# Patient Record
Sex: Female | Born: 1965
Health system: Southern US, Community
[De-identification: ages and names within clinical notes are randomized; demographics above are authoritative.]

## PROBLEM LIST (undated history)

## (undated) DIAGNOSIS — E559 Vitamin D deficiency, unspecified: Secondary | ICD-10-CM

## (undated) DIAGNOSIS — E0590 Thyrotoxicosis, unspecified without thyrotoxic crisis or storm: Secondary | ICD-10-CM

## (undated) DIAGNOSIS — M797 Fibromyalgia: Secondary | ICD-10-CM

## (undated) DIAGNOSIS — R002 Palpitations: Secondary | ICD-10-CM

## (undated) DIAGNOSIS — I10 Essential (primary) hypertension: Secondary | ICD-10-CM

## (undated) DIAGNOSIS — M351 Other overlap syndromes: Secondary | ICD-10-CM

## (undated) DIAGNOSIS — F419 Anxiety disorder, unspecified: Secondary | ICD-10-CM

## (undated) DIAGNOSIS — I7300 Raynaud's syndrome without gangrene: Secondary | ICD-10-CM

## (undated) DIAGNOSIS — E119 Type 2 diabetes mellitus without complications: Secondary | ICD-10-CM

## (undated) DIAGNOSIS — G4700 Insomnia, unspecified: Secondary | ICD-10-CM

## (undated) DIAGNOSIS — E782 Mixed hyperlipidemia: Secondary | ICD-10-CM

## (undated) DIAGNOSIS — M5136 Other intervertebral disc degeneration, lumbar region: Secondary | ICD-10-CM

## (undated) DIAGNOSIS — E785 Hyperlipidemia, unspecified: Secondary | ICD-10-CM

## (undated) DIAGNOSIS — F32A Depression, unspecified: Secondary | ICD-10-CM

## (undated) DIAGNOSIS — M359 Systemic involvement of connective tissue, unspecified: Secondary | ICD-10-CM

## (undated) DIAGNOSIS — G894 Chronic pain syndrome: Secondary | ICD-10-CM

## (undated) DIAGNOSIS — R079 Chest pain, unspecified: Secondary | ICD-10-CM

## (undated) DIAGNOSIS — E538 Deficiency of other specified B group vitamins: Secondary | ICD-10-CM

## (undated) HISTORY — DX: Anxiety disorder, unspecified: F41.9

## (undated) HISTORY — DX: Insomnia, unspecified: G47.00

## (undated) HISTORY — DX: Fibromyalgia: M79.7

## (undated) HISTORY — DX: Other intervertebral disc degeneration, lumbar region: M51.36

## (undated) HISTORY — DX: Chest pain, unspecified: R07.9

## (undated) HISTORY — DX: Systemic involvement of connective tissue, unspecified: M35.9

## (undated) HISTORY — DX: Depression, unspecified: F32.A

## (undated) HISTORY — DX: Raynaud's syndrome without gangrene: I73.00

## (undated) HISTORY — DX: Deficiency of other specified B group vitamins: E53.8

## (undated) HISTORY — DX: Vitamin D deficiency, unspecified: E55.9

## (undated) HISTORY — DX: Mixed hyperlipidemia: E78.2

## (undated) HISTORY — DX: Hyperlipidemia, unspecified: E78.5

## (undated) HISTORY — DX: Essential (primary) hypertension: I10

## (undated) HISTORY — DX: Other overlap syndromes: M35.1

## (undated) HISTORY — DX: Palpitations: R00.2

## (undated) HISTORY — DX: Chronic pain syndrome: G89.4

## (undated) HISTORY — DX: Thyrotoxicosis, unspecified without thyrotoxic crisis or storm: E05.90

## (undated) HISTORY — DX: Type 2 diabetes mellitus without complications: E11.9

---

## 1989-02-21 HISTORY — PX: TUBAL LIGATION: SHX77

## 2001-10-25 ENCOUNTER — Inpatient Hospital Stay (HOSPITAL_COMMUNITY): Admission: EM | Admit: 2001-10-25 | Discharge: 2001-11-01 | Payer: Self-pay | Admitting: Psychiatry

## 2002-02-21 HISTORY — PX: ANTERIOR CRUCIATE LIGAMENT REPAIR: SHX115

## 2002-07-24 ENCOUNTER — Encounter: Payer: Self-pay | Admitting: Emergency Medicine

## 2002-07-24 ENCOUNTER — Emergency Department (HOSPITAL_COMMUNITY): Admission: EM | Admit: 2002-07-24 | Discharge: 2002-07-24 | Payer: Self-pay | Admitting: Emergency Medicine

## 2002-08-14 ENCOUNTER — Encounter: Admission: RE | Admit: 2002-08-14 | Discharge: 2002-08-14 | Payer: Self-pay | Admitting: Internal Medicine

## 2002-08-14 ENCOUNTER — Encounter: Payer: Self-pay | Admitting: Internal Medicine

## 2003-09-23 ENCOUNTER — Ambulatory Visit (HOSPITAL_COMMUNITY): Admission: RE | Admit: 2003-09-23 | Discharge: 2003-09-23 | Payer: Self-pay | Admitting: Internal Medicine

## 2003-10-10 ENCOUNTER — Emergency Department (HOSPITAL_COMMUNITY): Admission: EM | Admit: 2003-10-10 | Discharge: 2003-10-10 | Payer: Self-pay | Admitting: Emergency Medicine

## 2003-10-20 ENCOUNTER — Ambulatory Visit (HOSPITAL_COMMUNITY): Admission: RE | Admit: 2003-10-20 | Discharge: 2003-10-20 | Payer: Self-pay | Admitting: Internal Medicine

## 2006-01-01 IMAGING — CR DG CHEST 2V
2 series · 2 of 2 positions shown · non-contrast
Comparison: none

CLINICAL DATA: Chest pain and shortness of breath.  
 TWO VIEW CHEST
 The heart size and mediastinal contours are normal. The lungs are clear. The visualized skeleton is unremarkable.

 IMPRESSION
 No active disease.

[view not recorded (1 of 2)]
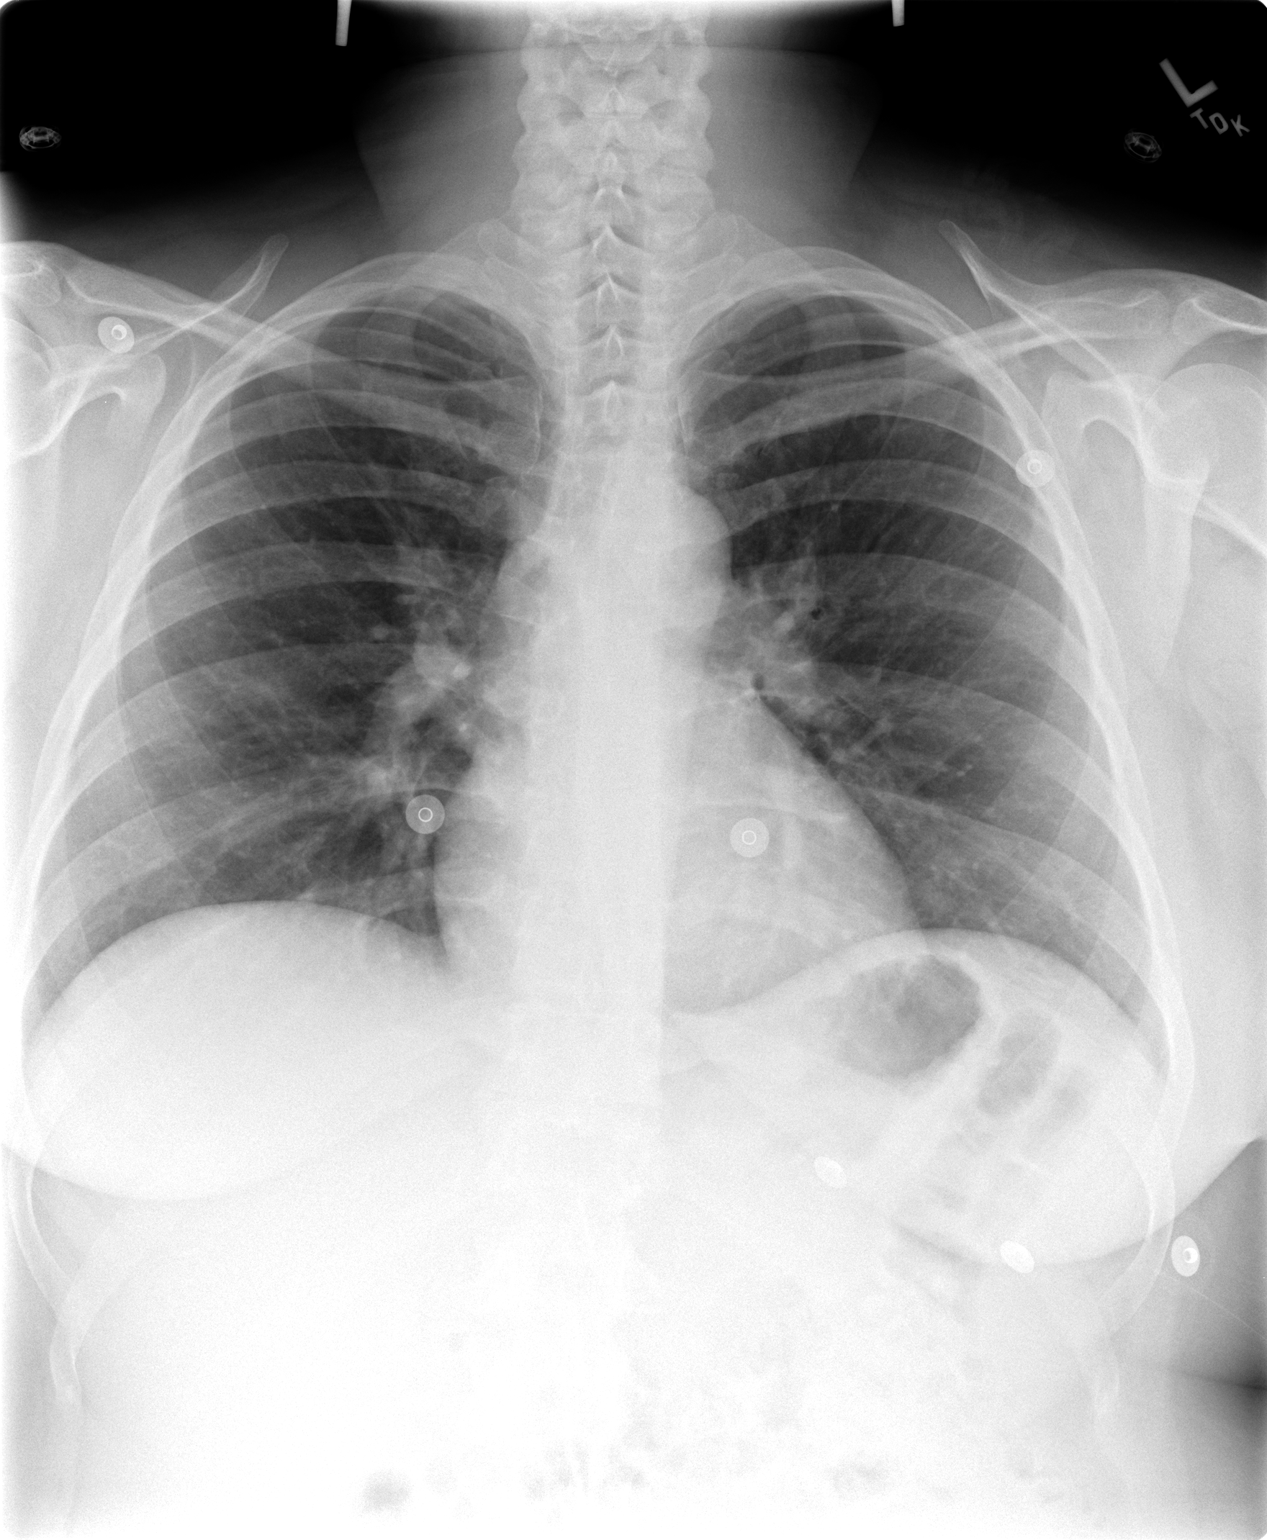

[view not recorded (2 of 2)]
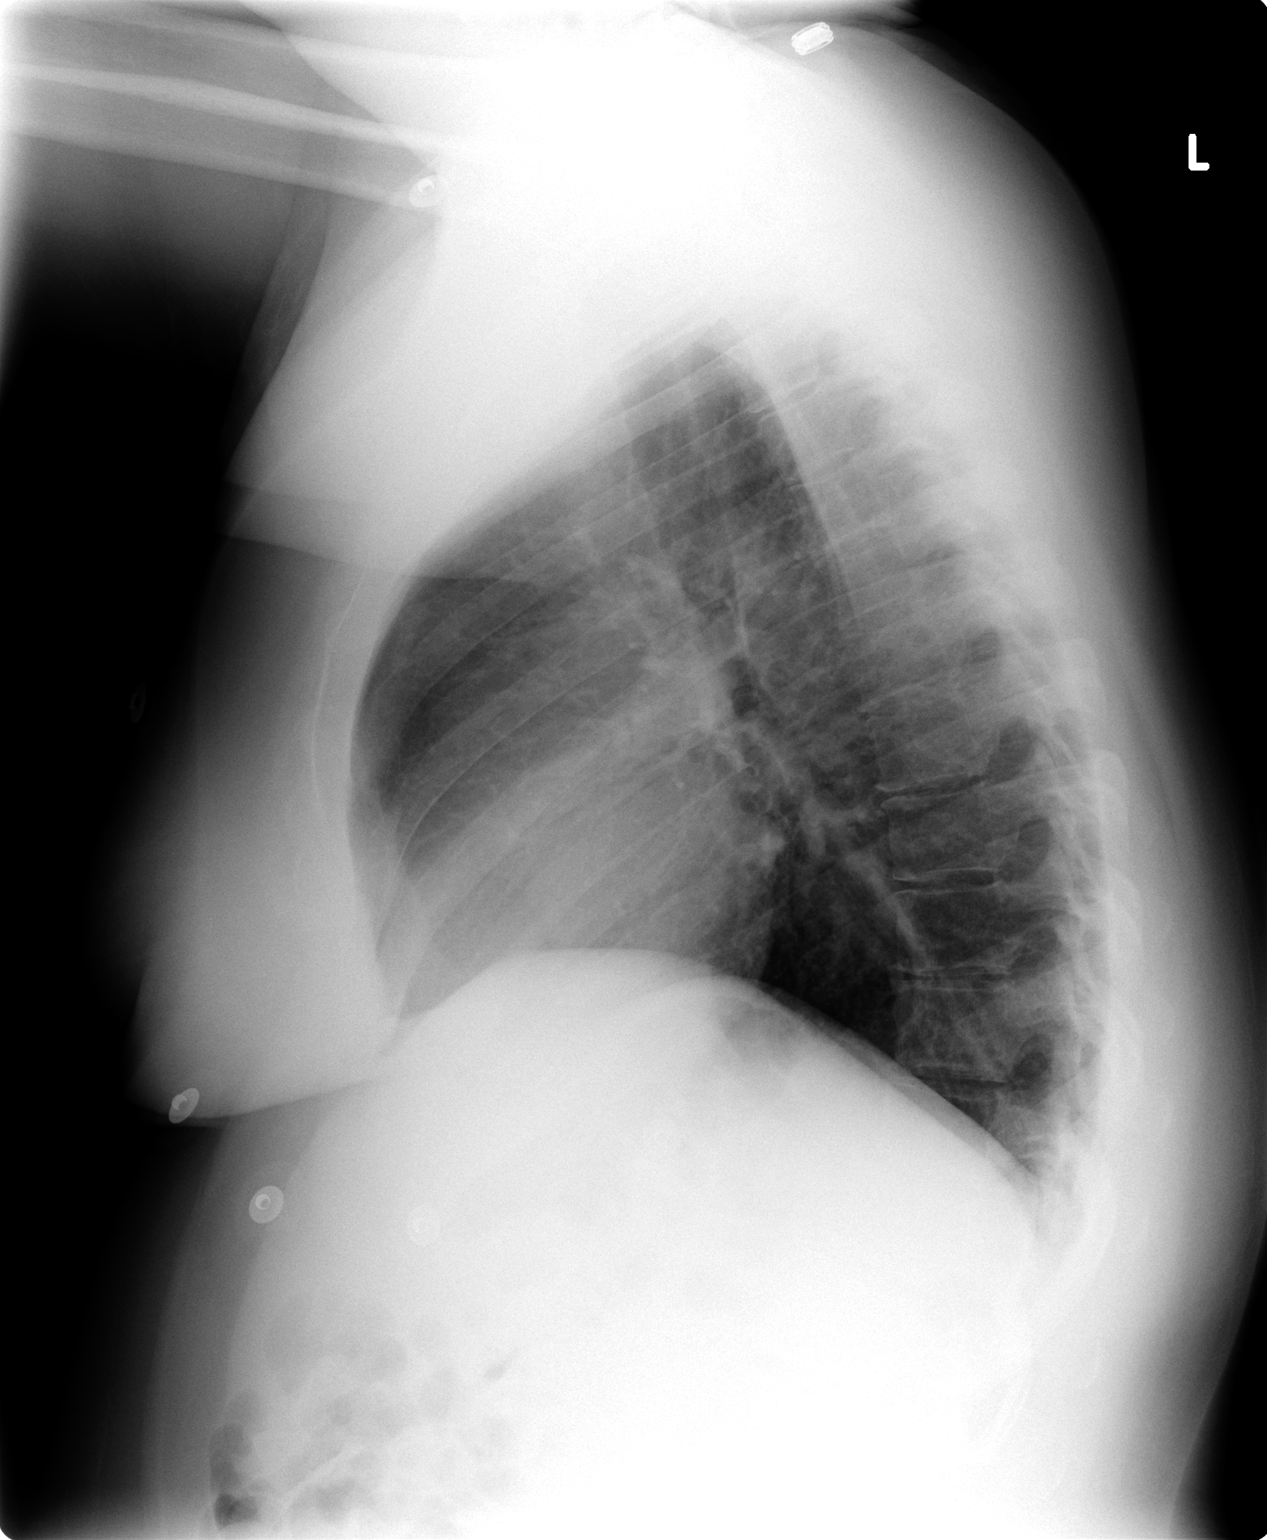

[2 of 2 positions shown; findings below may reference images not displayed]

## 2006-01-11 IMAGING — CT CT PELVIS W/ CM
1 of 2 series · 15 of 32 positions shown, 19 images · IV contrast (GASTRO & 100 ML OMNI)
Comparison: none

CLINICAL DATA: Lower extremity swelling and pain.  Unexplained weight loss.  Evaluate for abdominal or pelvic mass causing obstructing lung edema.  
 ABDOMEN CT WITH CONTRAST
TECHNIQUE: Multidetector CT imaging of the abdomen and pelvis are performed during administration of 100 cc of Omnipaque 300 intravenous contrast.  Oral contrast was also administered.  There are no prior studies for comparison.
 The abdominal parenchymal organs are normal in appearance.  There is no evidence of abnormal soft tissue masses or lymph adenopathy within the abdomen.  The bowel loops are unremarkable in appearance.  There is no evidence of inflammatory process or ascites.  The lung bases are also clear.
 IMPRESSION
 Negative abdomen CT.  No evidence of mass or adenopathy.
 PELVIS CT WITH CONTRAST
 A large cystic lesion, which has a simple appearance, is seen in the left adnexa, which measures 4.2 x 5.3 cm.  There is no evidence of an associated solid soft tissue mass or wall irregularity, and this likely represents a cyst arising from the left ovary.  
 No other pelvic masses are seen and there is no evidence of lymph adenopathy.  There is no evidence of the inflammatory process or ascites.  The uterus is normal in size.
 5 cm simple cystic lesion in the left adnexa, likely arising from the left ovary.  This may represent a physiologic cyst in a premenopausal patient, and follow-up ultrasound is recommended in six weeks for further evaluation.

[Series 2: abd pelvis · axial · 0.79mm/px · z∈[-445,-60]mm · 15 of 85 slices shown, 19 images]
[im 4/85  soft-tissue]
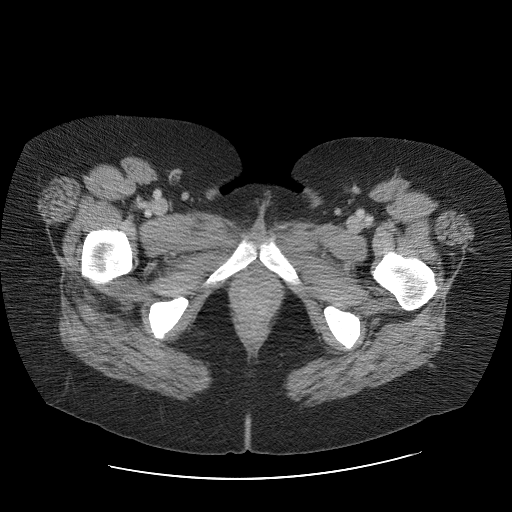
[im 4/85  bone]
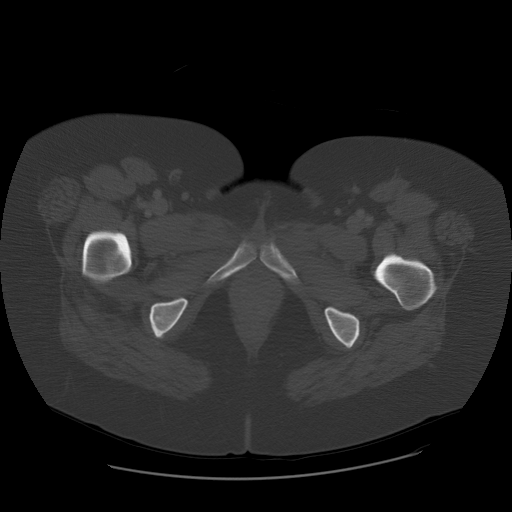
[im 11/85  soft-tissue]
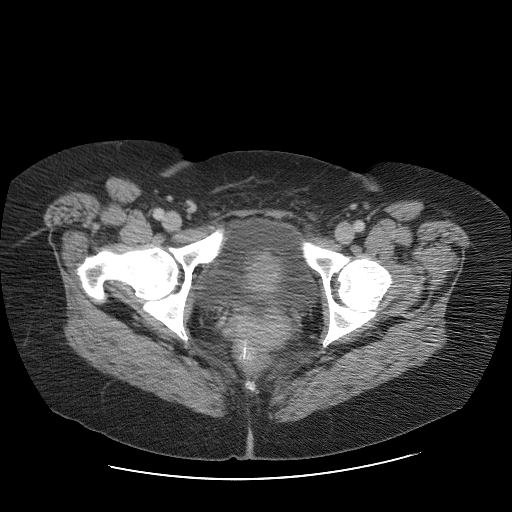
[im 18/85  soft-tissue]
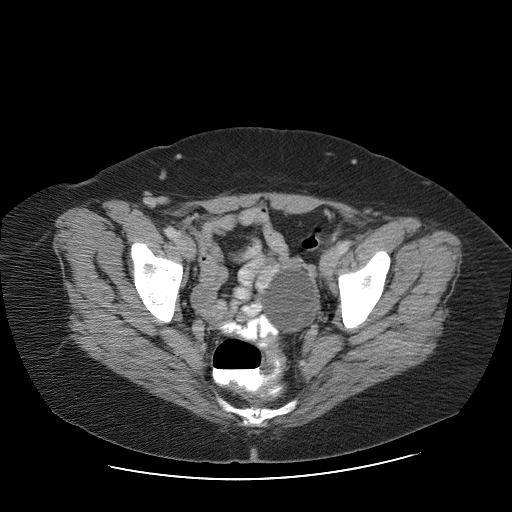
[im 25/85  soft-tissue]
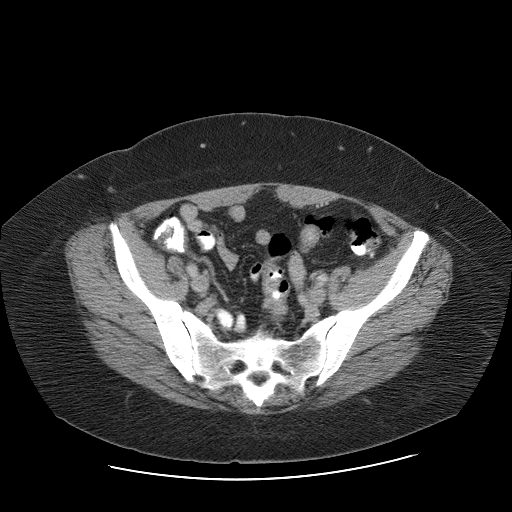
[im 29/85  soft-tissue]
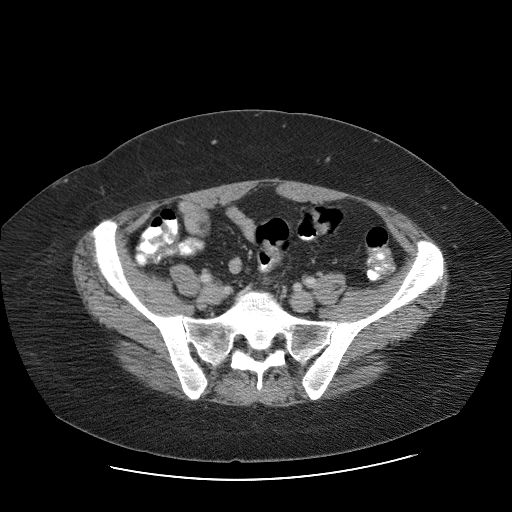
[im 36/85  soft-tissue]
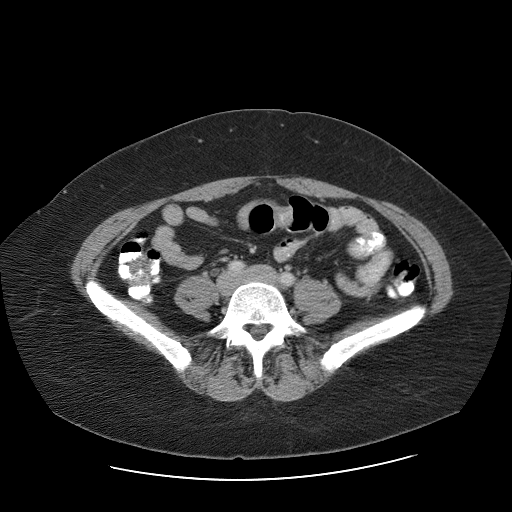
[im 43/85  soft-tissue]
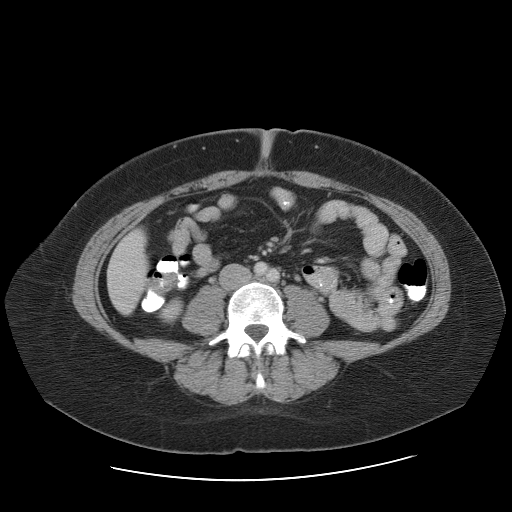
[im 50/85  soft-tissue]
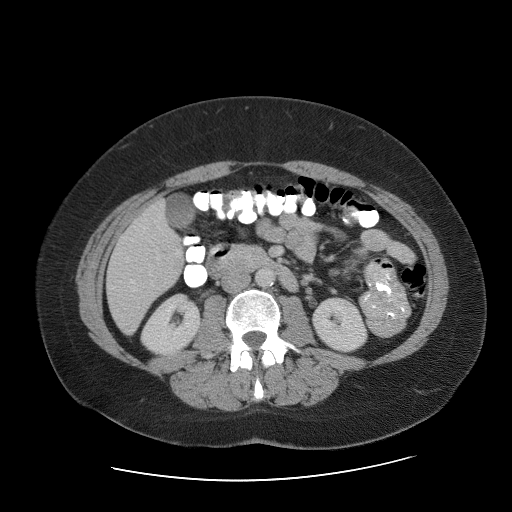
[im 57/85  soft-tissue]
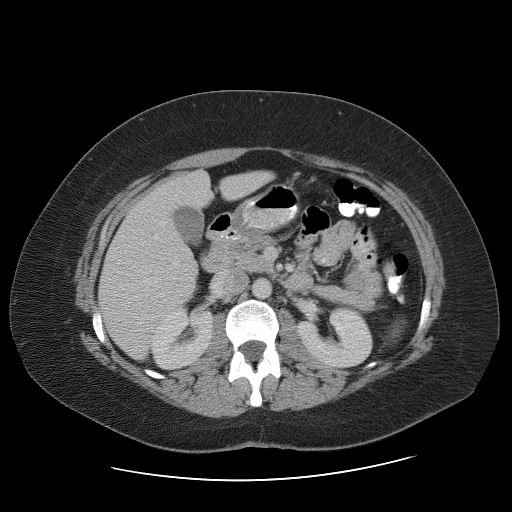
[im 57/85  bone]
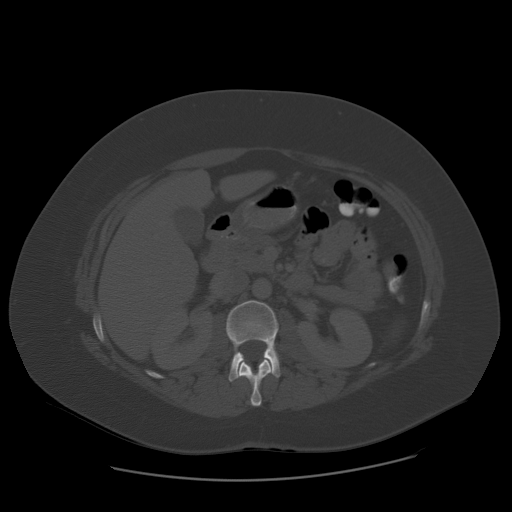
[im 60/85  soft-tissue]
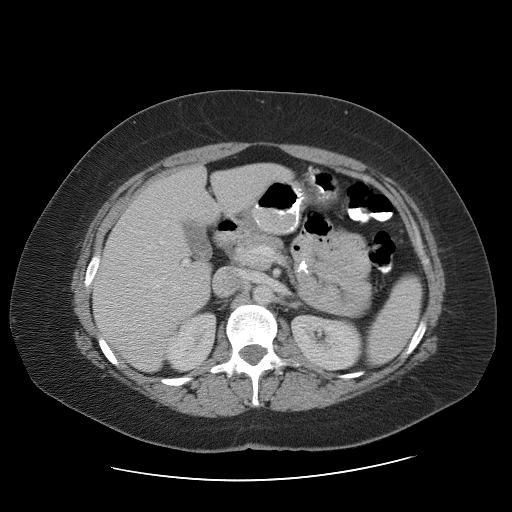
[im 67/85  soft-tissue]
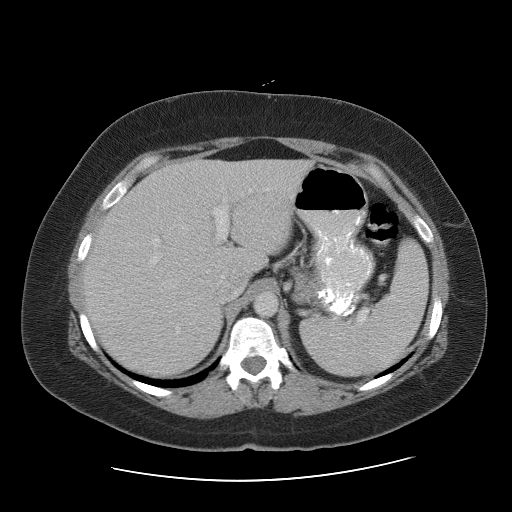
[im 71/85  lung]
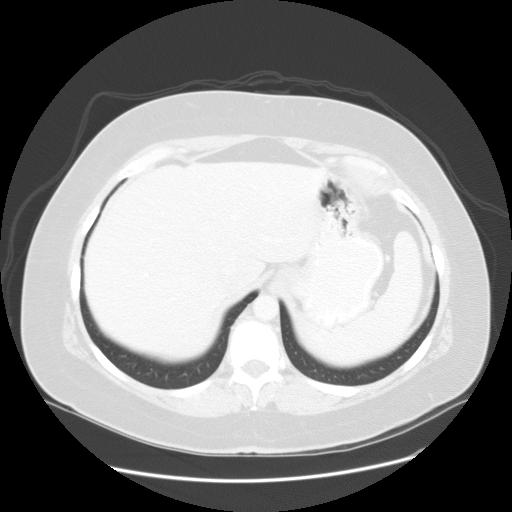
[im 74/85  soft-tissue]
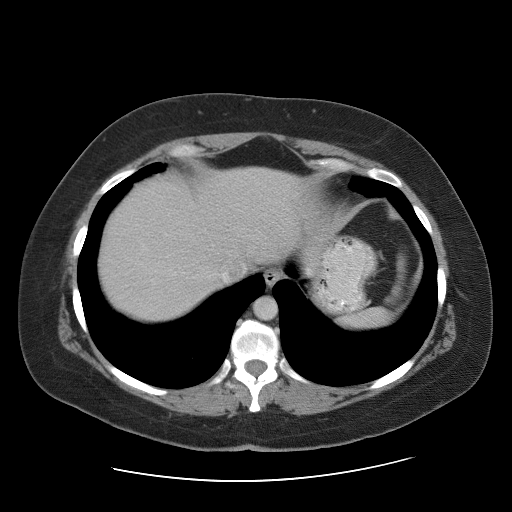
[im 74/85  lung]
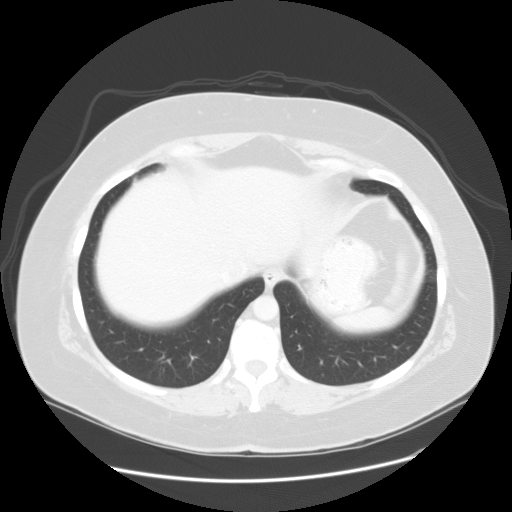
[im 78/85  lung]
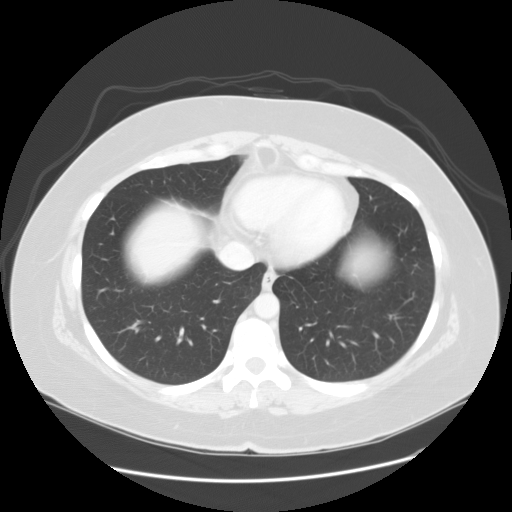
[im 81/85  soft-tissue]
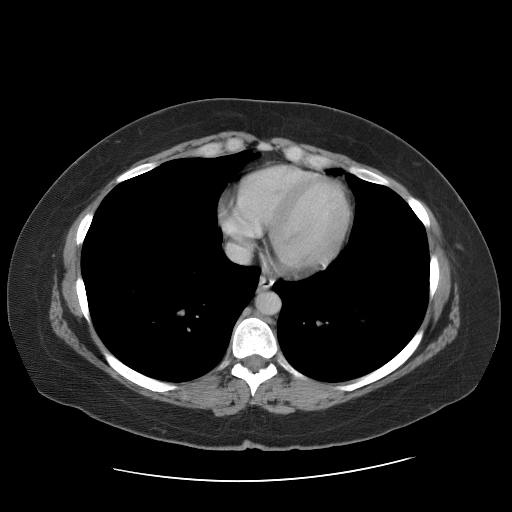
[im 81/85  lung]
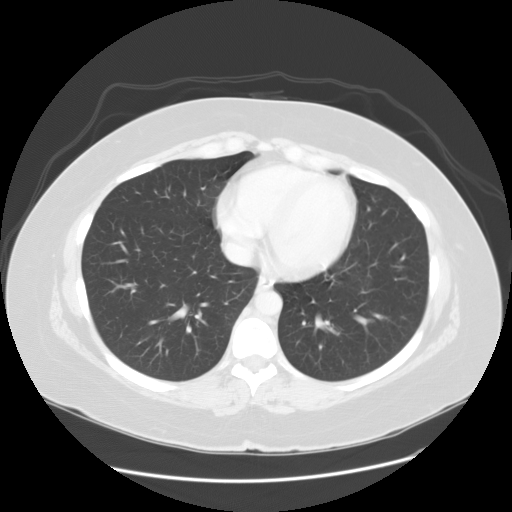

[15 of 32 positions shown; findings below may reference images not displayed]

## 2006-06-12 ENCOUNTER — Ambulatory Visit: Payer: Self-pay | Admitting: Vascular Surgery

## 2013-11-18 HISTORY — PX: OTHER SURGICAL HISTORY: SHX169

## 2019-12-10 DIAGNOSIS — U071 COVID-19: Secondary | ICD-10-CM | POA: Insufficient documentation

## 2019-12-10 DIAGNOSIS — Z7982 Long term (current) use of aspirin: Secondary | ICD-10-CM | POA: Insufficient documentation

## 2021-05-10 ENCOUNTER — Other Ambulatory Visit: Payer: Self-pay

## 2021-05-10 DIAGNOSIS — R002 Palpitations: Secondary | ICD-10-CM | POA: Insufficient documentation

## 2021-05-10 DIAGNOSIS — E119 Type 2 diabetes mellitus without complications: Secondary | ICD-10-CM | POA: Insufficient documentation

## 2021-05-10 DIAGNOSIS — G4700 Insomnia, unspecified: Secondary | ICD-10-CM | POA: Insufficient documentation

## 2021-05-10 DIAGNOSIS — E538 Deficiency of other specified B group vitamins: Secondary | ICD-10-CM | POA: Insufficient documentation

## 2021-05-10 DIAGNOSIS — E782 Mixed hyperlipidemia: Secondary | ICD-10-CM | POA: Insufficient documentation

## 2021-05-10 DIAGNOSIS — E0590 Thyrotoxicosis, unspecified without thyrotoxic crisis or storm: Secondary | ICD-10-CM | POA: Insufficient documentation

## 2021-05-10 DIAGNOSIS — M5136 Other intervertebral disc degeneration, lumbar region: Secondary | ICD-10-CM | POA: Insufficient documentation

## 2021-05-10 DIAGNOSIS — M351 Other overlap syndromes: Secondary | ICD-10-CM | POA: Insufficient documentation

## 2021-05-10 DIAGNOSIS — M359 Systemic involvement of connective tissue, unspecified: Secondary | ICD-10-CM | POA: Insufficient documentation

## 2021-05-10 DIAGNOSIS — E785 Hyperlipidemia, unspecified: Secondary | ICD-10-CM | POA: Insufficient documentation

## 2021-05-10 DIAGNOSIS — M797 Fibromyalgia: Secondary | ICD-10-CM | POA: Insufficient documentation

## 2021-05-10 DIAGNOSIS — I10 Essential (primary) hypertension: Secondary | ICD-10-CM | POA: Insufficient documentation

## 2021-05-10 DIAGNOSIS — R079 Chest pain, unspecified: Secondary | ICD-10-CM | POA: Insufficient documentation

## 2021-05-10 DIAGNOSIS — E559 Vitamin D deficiency, unspecified: Secondary | ICD-10-CM | POA: Insufficient documentation

## 2021-05-10 DIAGNOSIS — F419 Anxiety disorder, unspecified: Secondary | ICD-10-CM | POA: Insufficient documentation

## 2021-05-10 DIAGNOSIS — F32A Depression, unspecified: Secondary | ICD-10-CM | POA: Insufficient documentation

## 2021-05-10 DIAGNOSIS — G894 Chronic pain syndrome: Secondary | ICD-10-CM | POA: Insufficient documentation

## 2021-05-10 DIAGNOSIS — I7300 Raynaud's syndrome without gangrene: Secondary | ICD-10-CM | POA: Insufficient documentation

## 2021-05-21 ENCOUNTER — Ambulatory Visit: Payer: BC Managed Care – PPO | Admitting: Cardiovascular Disease

## 2021-05-24 NOTE — Progress Notes (Addendum)
?Cardiology Office Note:   ? ?Date:  05/25/2021  ? ?ID:  Alexandra Good, DOB [birth year], MRN [id number] ? ?PCP:  Zoila Shutter, NP  ?Cardiologist:  None   ? ?Referring MD: Zoila Shutter, NP  ? ?No chief complaint on file. ? ? ?History of Present Illness:   ? ?Alexandra Good is a 56 y.o. female with a hx of hypertension, hyperlipidemia and hyperthyroidism here today for the evaluation of chest pain, palpitations, ans hypertension at the request of Irven Shelling, NP. She was seen in the ED 05/20/21 for chest pain and palpitations and her blood pressure was elevated at 148/84. She was treated with morphine, fentanyl, and a GI cocktail. EKG showed frequent PVCs. Aortic CT was negative for dissection.  ? ?She previously saw cardiology in [facility] in 2021. She wore a monitor that showed some bradycardia to the 40s and 50s at night, 20000 PVCs, and ventricular bigeminy. She reported home blood pressures of 062I to 948N systolic though her in-office pressure was 140/90. Her symptoms were thought to be due to anxiety and was was started on metoprolol. However, she was hesitant to start due to low heart rates on her pulse oximeter. The cardiologist felt that these were inaccurate due to ventricular bigeminy. Echo at this time was LVEF 50-55%.  ? ?Today, she is accompanied by her husband and not doing the best. Occasionally, she reports pressure and tightness deep under her L breast with associated palpitations. Her heart rate could range from 30s to 70s. These episodes occur randomly regardless of exertion and leave her feeling fatigued. She has been told she has left ventricle hardening. Her husband endorses she snores. She does not feel rested in the morning. However, she has taken a sleep study in the past and was told she did not have sleep apnea. Her husband reports her feet and ankles will swell but she associates this to being on her feet all day at work. She also reports occasional shortness of breath. Her blood  pressure at home can increase up to 180/100. Her blood pressure in the ER was 462V systolic. Metoprolol caused her heart rate to stay in the 50s and caused her to feel constantly fatigued. She drinks 1 cup of coffee in the morning and 1 more after work and water throughout the day. 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 She does not watch her salt intake. Currently, she takes both losartan and lisinopril. She can take clonidine 3 to 4 times per week. She endorses swelling on amlodipine. Her father died of MI at 6 years old. Her mother has Afib and has stents placed. Her maternal aunt had strokes. Her maternal uncle also died of MI. She denies any lightheadedness, headaches, syncope, orthopnea, or PND. ? ?Past Medical History:  ?Diagnosis Date  ? Anxiety   ? Chest pain   ? Chronic pain disorder   ? DDD (degenerative disc disease), lumbar   ? Depression   ? Essential (primary) hypertension   ? Fibromyalgia   ? HTN (hypertension)   ? Hyperlipidemia   ? Hyperthyroidism   ? Insomnia   ? Mixed connective tissue disease ([place])   ? Mixed hyperlipidemia   ? Palpitations   ? Palpitations   ? PVC (premature ventricular contraction) 05/25/2021  ? Raynaud's syndrome   ? Systemic involvement of connective tissue ([place])   ? Type 2 diabetes mellitus without complications ([place])   ?  Vitamin B12 deficiency   ? Vitamin D deficiency   ? ? ?Past Surgical History:  ?Procedure Laterality Date  ? ANTERIOR CRUCIATE LIGAMENT REPAIR Right 2004  ? right hip replacement Right 11/18/2013  ? TUBAL LIGATION  1991  ? ? ?Current Medications: ?Current Meds  ?Medication Sig  ? ALPRAZolam (XANAX) 0.5 MG tablet Take 0.5 mg by mouth 2 (two) times daily.  ? carvedilol (COREG) 6.25 MG tablet Take 1 tablet (6.25 mg total) by mouth 2 (two) times daily.  ? cloNIDine (CATAPRES) 0.1 MG tablet 1 tablet when diastolic is above 90 mmHg  ? diclofenac Sodium (VOLTAREN) 1 % GEL Apply 1 application.  topically 2 (two) times daily as needed for pain.  ? gabapentin (NEURONTIN) 300 MG capsule Take 300 mg by mouth 2 (two) times daily.  ? hydroxychloroquine (PLAQUENIL) 200 MG tablet Take 200 mg by mouth 2 (two) times daily.  ? ibuprofen (ADVIL) 800 MG tablet Take 800 mg by mouth 3 (three) times daily as needed for pain.  ? levothyroxine (SYNTHROID) 75 MCG tablet Take 75 mcg by mouth daily before breakfast.  ? tiZANidine (ZANAFLEX) 4 MG tablet Take 4 mg by mouth 3 (three) times daily as needed for muscle spasms.  ? topiramate (TOPAMAX) 100 MG tablet Take 100 mg by mouth daily.  ? valACYclovir (VALTREX) 1000 MG tablet Take 1,000 mg by mouth daily.  ? valsartan-hydrochlorothiazide (DIOVAN-HCT) 320-25 MG tablet Take 1 tablet by mouth daily.  ? [DISCONTINUED] hydrochlorothiazide (HYDRODIURIL) 50 MG tablet Take 50 mg by mouth in the morning.  ? [DISCONTINUED] lisinopril (ZESTRIL) 40 MG tablet Take 20 mg by mouth 2 (two) times daily.  ? [DISCONTINUED] losartan (COZAAR) 50 MG tablet Take 50 mg by mouth daily.  ?  ? ?Allergies:   Amlodipine besylate and Crestor [rosuvastatin]  ? ?Social History  ? ?Socioeconomic History  ? Marital status: Married  ?  Spouse name: Not on file  ? Number of children: Not on file  ? Years of education: Not on file  ? Highest education level: Not on file  ?Occupational History  ? Not on file  ?Tobacco Use  ? Smoking status: Never  ? Smokeless tobacco: Never  ?Substance and Sexual Activity  ? Alcohol use: Not on file  ? Drug use: Not on file  ? Sexual activity: Not on file  ?Other Topics Concern  ? Not on file  ?Social History Narrative  ? ** Merged History Encounter **  ?    ? ?Social Determinants of Health  ? ?Financial Resource Strain: Low Risk   ? Difficulty of Paying Living Expenses: Not hard at all  ?Food Insecurity: No Food Insecurity  ? Worried About Charity fundraiser in the Last Year: Never true  ? Ran Out of Food in the Last Year: Never true  ?Transportation Needs: No Transportation  Needs  ? Lack of Transportation (Medical): No  ? Lack of Transportation (Non-Medical): No  ?Physical Activity: Inactive  ? Days of Exercise per Week: 0 days  ? Minutes of Exercise per Session: 0 min  ?Stress: Not on file  ?Social Connections: Not on file  ?  ? ?Family History: ?The patient's family history includes Atrial fibrillation in her mother; Coronary artery disease in her mother; Heart attack in her maternal uncle; Heart attack (age of onset: 35) in her father; Stroke in her maternal aunt. ? ?ROS:   ?Please see the history of present illness.    ?(+) Chest pain ?(+) Palpitations ?(+) Fatigue ?(+) Snoring ?(+)  Daytime somnolence ?(+) LE edema (Bilateral) ?(+) Shortness of breath ?All other systems reviewed and negative.  ? ?EKGs/Labs/Other Studies Reviewed:   ? ?The following studies were reviewed today: ?CTA Chest 05/13/21 ([place]) ?No evidence of acute aortic dissection  ? ?AORTA: Normal caliber aorta. No thoracic aortic intramural hematoma.  No aortic dissection.  ? ?CHEST: Normal heart size.  No pericardial effusion. No mediastinal lymphadenopathy.  ?Clear central airways. No consolidation.  No pleural effusion.  ? ?ABDOMEN and PELVIS:  ?HEPATOBILIARY: No focal hepatic lesions. The gallbladder is surgically absent. No biliary dilatation.    ?SPLEEN: Unremarkable.  ?PANCREAS: Focal fatty atrophy of the pancreatic head. Otherwise, unremarkable.  ? ?ADRENALS: Unremarkable.  ?KIDNEYS/URETERS: No hydronephrosis or suspicious mass.  ? ?BLADDER: Unremarkable.  ?PELVIC/REPRODUCTIVE ORGANS: The uterus is present.  ? ?GI TRACT: No dilated or thick walled loops of bowel. Appendix is unremarkable (6:155). Colonic diverticulosis without evidence of diverticulitis.  ? ?PERITONEUM/RETROPERITONEUM AND MESENTERY: No free air or fluid.  ?LYMPH NODES: No enlarged lymph nodes. Prominent right external iliac lymph node (6:148).  ?BONES: Right total hip arthroplasty with associated metallic streak  artifact.  ? ?Mild lower thoracic and lumbar spine degenerative disease.  ? ?SOFT TISSUES: Small fat-containing periumbilical hernia. Fatty atrophy of the right iliac a and psoas musculature. Bilateral fat-containing ingu

## 2021-05-25 ENCOUNTER — Ambulatory Visit (INDEPENDENT_AMBULATORY_CARE_PROVIDER_SITE_OTHER): Payer: BC Managed Care – PPO | Admitting: Cardiovascular Disease

## 2021-05-25 ENCOUNTER — Encounter (HOSPITAL_BASED_OUTPATIENT_CLINIC_OR_DEPARTMENT_OTHER): Payer: Self-pay | Admitting: Cardiovascular Disease

## 2021-05-25 VITALS — BP 160/96 | HR 71 | Ht 61.0 in | Wt 211.9 lb

## 2021-05-25 DIAGNOSIS — R0683 Snoring: Secondary | ICD-10-CM | POA: Diagnosis not present

## 2021-05-25 DIAGNOSIS — Z5181 Encounter for therapeutic drug level monitoring: Secondary | ICD-10-CM

## 2021-05-25 DIAGNOSIS — I10 Essential (primary) hypertension: Secondary | ICD-10-CM | POA: Diagnosis not present

## 2021-05-25 DIAGNOSIS — I493 Ventricular premature depolarization: Secondary | ICD-10-CM | POA: Diagnosis not present

## 2021-05-25 DIAGNOSIS — R400 Somnolence: Secondary | ICD-10-CM | POA: Diagnosis not present

## 2021-05-25 HISTORY — DX: Ventricular premature depolarization: I49.3

## 2021-05-25 MED ORDER — CARVEDILOL 6.25 MG PO TABS: 6.2500 mg | ORAL_TABLET | Freq: Two times a day (BID) | ORAL | 3 refills | Status: DC

## 2021-05-25 MED ORDER — VALSARTAN-HYDROCHLOROTHIAZIDE 320-25 MG PO TABS: 1.0000 | ORAL_TABLET | Freq: Every day | ORAL | 3 refills | Status: DC

## 2021-05-25 NOTE — Assessment & Plan Note (Signed)
Blood pressure is very poorly controlled.  Right now she is on both lisinopril and losartan.  We will stop both and switch to valsartan/HCTZ 320/25 mg daily.  Stop her current HCTZ 50 mg daily.  We will also add carvedilol 6.25 mg twice daily.  She did not tolerate higher doses of metoprolol due to bradycardia, otherwise suspect that she does not have true bradycardia and this was PVC related.  She will check her blood pressures and heart rates and bring to follow-up.  She will work on limiting the sodium in her diet and try to exercise more. ?

## 2021-05-25 NOTE — Patient Instructions (Addendum)
Medication Instructions:  ?STOP LISINOPRIL  ? ?STOP LOSARTAN  ? ?STOP HYDROCHLOROTHIAZIDE ? ?START CARVEDILOL 6.25 MG TWICE A DAY  ? ?START VALSARTAN HCT 320-25 MG DAILY   ? ?*If you need a refill on your cardiac medications before your next appointment, please call your pharmacy* ? ?Lab Work: ?BMET IN 1 WEEK  ? ?If you have labs (blood work) drawn today and your tests are completely normal, you will receive your results only by: ?MyChart Message (if you have MyChart) OR ?A paper copy in the mail ?If you have any lab test that is abnormal or we need to change your treatment, we will call you to review the results. ? ?Testing/Procedures: ?Your physician has requested that you have an echocardiogram. Echocardiography is a painless test that uses sound waves to create images of your heart. It provides your doctor with information about the size and shape of your heart and how well your heart?s chambers and valves are working. This procedure takes approximately one hour. There are no restrictions for this procedure. ?IN [place]  ? ?HOME SLEEP STUDY  ? ?Follow-Up: ?At [facility], you and your health needs are our priority.  As part of our continuing mission to provide you with exceptional heart care, we have created designated Provider Care Teams.  These Care Teams include your primary Cardiologist (physician) and Advanced Practice Providers (APPs -  Physician Assistants and Nurse Practitioners) who all work together to provide you with the care you need, when you need it. ? ?We recommend signing up for the patient portal called "MyChart".  Sign up information is provided on this After Visit Summary.  MyChart is used to connect with patients for Virtual Visits (Telemedicine).  Patients are able to view lab/test results, encounter notes, upcoming appointments, etc.  Non-urgent messages can be sent to your provider as well.   ?To learn more about what you can do with MyChart, go to [email or web address].   ? ?Your  next appointment:   ?07/29/2021 10:00 AM WITH DR [place]  ? ?You have been referred to ELECTROPHYSIOLOGIST  ?

## 2021-05-25 NOTE — Assessment & Plan Note (Signed)
She has very frequent PVCs in bigeminy and trigeminy.  Automated recordings have demonstrated rates in the 30s to 40s.  However on her ambulatory monitor is in the past she has not had this much bradycardia.  I suspect there are missed reading due to her PVCs.  She felt poorly on metoprolol.  I think she would do better with an antiarrhythmic.  Her last echo was over 2 years ago and she is not having some swelling.  We will get an echo to make sure she does not have any PVC related cardiomyopathy.  Start carvedilol 6.25 mg twice daily.  Refer to EP for consideration of antiarrhythmics or ablation.  Her PVCs seem to be monomorphic.  Additionally, we will repeat a sleep study.  Her last one was over 10 years ago and she has gained weight since that time.  She reports both bradycardia, daytime somnolence, and her husband has noted apneic episodes. ?

## 2021-06-01 ENCOUNTER — Ambulatory Visit (INDEPENDENT_AMBULATORY_CARE_PROVIDER_SITE_OTHER): Payer: BC Managed Care – PPO

## 2021-06-01 ENCOUNTER — Other Ambulatory Visit (HOSPITAL_BASED_OUTPATIENT_CLINIC_OR_DEPARTMENT_OTHER): Payer: Self-pay

## 2021-06-01 DIAGNOSIS — I5030 Unspecified diastolic (congestive) heart failure: Secondary | ICD-10-CM | POA: Diagnosis not present

## 2021-06-01 DIAGNOSIS — I493 Ventricular premature depolarization: Secondary | ICD-10-CM

## 2021-06-01 DIAGNOSIS — I517 Cardiomegaly: Secondary | ICD-10-CM

## 2021-06-01 DIAGNOSIS — I10 Essential (primary) hypertension: Secondary | ICD-10-CM | POA: Diagnosis not present

## 2021-06-01 LAB — ECHOCARDIOGRAM COMPLETE
Area-P 1/2: 3.61 cm2
S' Lateral: 2.7 cm

## 2021-06-02 LAB — BASIC METABOLIC PANEL
BUN/Creatinine Ratio: 14 (ref 9–23)
BUN: 10 mg/dL (ref 6–24)
CO2: 25 mmol/L (ref 20–29)
Calcium: 9.5 mg/dL (ref 8.7–10.2)
Chloride: 107 mmol/L — ABNORMAL HIGH (ref 96–106)
Creatinine, Ser: 0.74 mg/dL (ref 0.57–1.00)
Glucose: 88 mg/dL (ref 70–99)
Potassium: 4.3 mmol/L (ref 3.5–5.2)
Sodium: 145 mmol/L — ABNORMAL HIGH (ref 134–144)
eGFR: 95 mL/min/{1.73_m2} (ref >59)

## 2021-06-09 ENCOUNTER — Ambulatory Visit: Payer: BC Managed Care – PPO | Admitting: Cardiology

## 2021-06-10 ENCOUNTER — Encounter (HOSPITAL_BASED_OUTPATIENT_CLINIC_OR_DEPARTMENT_OTHER): Payer: Self-pay | Admitting: Cardiovascular Disease

## 2021-06-10 NOTE — Telephone Encounter (Signed)
Hey she responded  ?

## 2021-06-23 ENCOUNTER — Telehealth (HOSPITAL_BASED_OUTPATIENT_CLINIC_OR_DEPARTMENT_OTHER): Payer: Self-pay | Admitting: *Deleted

## 2021-06-23 NOTE — Telephone Encounter (Signed)
-----   Message from Lauralee Evener, [place] sent at 06/23/2021 11:23 AM EDT ----- ?The sleep lab has left several messages for the patient trying to schedule her. She has not returned a call to them. ?----- Message ----- ?From: Earvin Hansen, LPN ?Sent: 06/22/2021   5:58 PM EDT ?To: [facility] Sleep Studies ? ?Hello all ? ?Just following up on sleep study ? ?Thanks ? ?Rip [place]  ? ? ?

## 2021-06-24 ENCOUNTER — Ambulatory Visit: Payer: BC Managed Care – PPO | Admitting: Gastroenterology

## 2021-06-24 ENCOUNTER — Other Ambulatory Visit: Payer: Self-pay

## 2021-06-24 ENCOUNTER — Encounter: Payer: Self-pay | Admitting: Gastroenterology

## 2021-06-24 VITALS — BP 171/90 | HR 50 | Temp 98.3°F | Ht 62.0 in | Wt 218.0 lb

## 2021-06-24 DIAGNOSIS — Z1211 Encounter for screening for malignant neoplasm of colon: Secondary | ICD-10-CM | POA: Diagnosis not present

## 2021-06-24 DIAGNOSIS — R109 Unspecified abdominal pain: Secondary | ICD-10-CM | POA: Diagnosis not present

## 2021-06-24 DIAGNOSIS — Z0000 Encounter for general adult medical examination without abnormal findings: Secondary | ICD-10-CM | POA: Insufficient documentation

## 2021-06-24 DIAGNOSIS — D518 Other vitamin B12 deficiency anemias: Secondary | ICD-10-CM | POA: Insufficient documentation

## 2021-06-24 DIAGNOSIS — J069 Acute upper respiratory infection, unspecified: Secondary | ICD-10-CM | POA: Insufficient documentation

## 2021-06-24 DIAGNOSIS — G4733 Obstructive sleep apnea (adult) (pediatric): Secondary | ICD-10-CM | POA: Insufficient documentation

## 2021-06-24 DIAGNOSIS — M5430 Sciatica, unspecified side: Secondary | ICD-10-CM | POA: Insufficient documentation

## 2021-06-24 DIAGNOSIS — F411 Generalized anxiety disorder: Secondary | ICD-10-CM | POA: Insufficient documentation

## 2021-06-24 DIAGNOSIS — R1013 Epigastric pain: Secondary | ICD-10-CM | POA: Insufficient documentation

## 2021-06-24 DIAGNOSIS — R110 Nausea: Secondary | ICD-10-CM | POA: Insufficient documentation

## 2021-06-24 DIAGNOSIS — Z791 Long term (current) use of non-steroidal anti-inflammatories (NSAID): Secondary | ICD-10-CM | POA: Diagnosis not present

## 2021-06-24 MED ORDER — NA SULFATE-K SULFATE-MG SULF 17.5-3.13-1.6 GM/177ML PO SOLN: 354.0000 mL | Freq: Once | ORAL | 0 refills | Status: AC

## 2021-06-24 MED ORDER — OMEPRAZOLE 40 MG PO CPDR: 40.0000 mg | DELAYED_RELEASE_CAPSULE | Freq: Every day | ORAL | 0 refills | Status: AC

## 2021-06-24 NOTE — Progress Notes (Signed)
?  ?Jonathon Bellows MD, MRCP(U.K) ?[address]  ?[address]  ?[place], [place] [address]  ?Main: [phone]  ?Fax: [phone] ? ? ?Gastroenterology Consultation ? ?Referring Provider:     Zoila Shutter, NP ?Primary Care Physician:  Zoila Shutter, NP ?Primary Gastroenterologist:  Dr. Jonathon Bellows  ?Reason for Consultation:   Abdominal pain ?      ? HPI:   ?Alexandra Good is a 56 y.o. y/o female referred for consultation & management  by  Zoila Shutter, NP.   ?She states that for over 6 to 8 months she has been having severe left-sided abdominal pain.  Occurs on a daily basis about 20 minutes after she eats.  She has had her gallbladder taken out some years back.  It radiates to the back.  Dull and sharp at times.  No clear aggravating factors.  No clear relieving factors.  Not relieved after bowel movement.  A lot of gas and bloating.  Denies any constipation.  Colonoscopy many years back.  She is overdue for it.  Denies any recent endoscopy.  Has been taking 800 mg of ibuprofen on a daily basis for many years for her back pain.  No recent use of PPI. ? ?She had a CT scan performed at [facility] on 05/13/2021 CT aortic dissection protocol that showed no evidence of acute aortic dissection.  Colonic diverticulosis but no diverticulitis. ?06/01/2021 creatinine 0.74, hemoglobin 13.6 g.  TSH 0.025. ? ? ?Past Medical History:  ?Diagnosis Date  ? Anxiety   ? Chest pain   ? Chronic pain disorder   ? DDD (degenerative disc disease), lumbar   ? Depression   ? Essential (primary) hypertension   ? Fibromyalgia   ? HTN (hypertension)   ? Hyperlipidemia   ? Hyperthyroidism   ? Insomnia   ? Mixed connective tissue disease ([place])   ? Mixed hyperlipidemia   ? Palpitations   ? Palpitations   ? PVC (premature ventricular contraction) 05/25/2021  ? Raynaud's syndrome   ? Systemic involvement of connective tissue ([place])   ? Type 2 diabetes mellitus without complications ([place])   ? Vitamin B12 deficiency   ? Vitamin D deficiency   ? ? ?Past  Surgical History:  ?Procedure Laterality Date  ? ANTERIOR CRUCIATE LIGAMENT REPAIR Right 2004  ? right hip replacement Right 11/18/2013  ? TUBAL LIGATION  1991  ? ? ?Prior to Admission medications   ?Medication Sig Start Date End Date Taking? Authorizing Provider  ?ALPRAZolam (XANAX) 0.5 MG tablet Take 0.5 mg by mouth 2 (two) times daily.   Yes [provider]  ?carvedilol (COREG) 6.25 MG tablet Take 1 tablet (6.25 mg total) by mouth 2 (two) times daily. 05/25/21  Yes Skeet Latch, MD  ?cloNIDine (CATAPRES) 0.1 MG tablet 1 tablet when diastolic is above 90 mmHg 04/22/21  Yes [provider]  ?diclofenac Sodium (VOLTAREN) 1 % GEL Apply 1 application. topically 2 (two) times daily as needed for pain.   Yes [provider]  ?gabapentin (NEURONTIN) 300 MG capsule Take 300 mg by mouth 2 (two) times daily.   Yes [provider]  ?hydroxychloroquine (PLAQUENIL) 200 MG tablet Take 200 mg by mouth 2 (two) times daily.   Yes [provider]  ?ibuprofen (ADVIL) 800 MG tablet Take 800 mg by mouth 3 (three) times daily as needed for pain.   Yes [provider]  ?levothyroxine (SYNTHROID) 75 MCG tablet Take 75 mcg by mouth daily before breakfast.  Yes [provider]  ?tiZANidine (ZANAFLEX) 4 MG tablet Take 4 mg by mouth 3 (three) times daily as needed for muscle spasms.   Yes [provider]  ?topiramate (TOPAMAX) 100 MG tablet Take 100 mg by mouth daily.   Yes [provider]  ?valACYclovir (VALTREX) 1000 MG tablet Take 1,000 mg by mouth daily.   Yes [provider]  ?valsartan-hydrochlorothiazide (DIOVAN-HCT) 320-25 MG tablet Take 1 tablet by mouth daily. 05/25/21  Yes Skeet Latch, MD  ? ? ?Family History  ?Problem Relation Age of Onset  ? Coronary artery disease Mother   ? Atrial fibrillation Mother   ? Heart attack Father 58  ? Stroke Maternal Aunt   ? Heart attack Maternal Uncle   ?  ? ?Social History  ? ?Tobacco Use  ? Smoking  status: Never  ? Smokeless tobacco: Never  ? ? ?Allergies as of 06/24/2021 - Review Complete 06/24/2021  ?Allergen Reaction Noted  ? Amlodipine besylate  05/10/2021  ? Crestor [rosuvastatin]  04/22/2021  ? ? ?Review of Systems:    ?All systems reviewed and negative except where noted in HPI. ? ? Physical Exam:  ?BP (!) 171/90   Pulse (!) 50   Temp 98.3 ?F (36.8 ?C) (Oral)   Ht '5\' 2"'$  (1.575 m)   Wt 218 lb (98.9 kg)   BMI 39.87 kg/m?  ?No LMP recorded. ?Psych:  Alert and cooperative. Normal mood and affect. ?General:   Alert,  Well-developed, well-nourished, pleasant and cooperative in NAD ?Head:  Normocephalic and atraumatic. ?Eyes:  Sclera clear, no icterus.   Conjunctiva pink. ?Ears:  Normal auditory acuity. ?Tenderness over the left paraspinal muscles in the midthoracic area ?Abdomen: Left upper quadrant mild tenderness normal bowel sounds.  No bruits.  Soft, non-tender and non-distended without masses, hepatosplenomegaly or hernias noted.  No guarding or rebound tenderness.    ?Neurologic:  Alert and oriented x3;  grossly normal neurologically. ?Psych:  Alert and cooperative. Normal mood and affect. ? ?Imaging Studies: ?ECHOCARDIOGRAM COMPLETE ? ?Result Date: 06/01/2021 ?   ECHOCARDIOGRAM REPORT   Patient Name:   Alexandra Good Date of Exam: 06/01/2021 Medical Rec #:  [id number]    Height:       61.0 in Accession #:    [id number]   Weight:       211.9 lb Date of Birth:  30-Jan-1966    BSA:          1.936 m? Patient Age:    54 years     BP:           160/96 mmHg Patient Gender: F            HR:           91 bpm. Exam Location:   Procedure: 2D Echo, Cardiac Doppler, Color Doppler and Strain Analysis Indications:    Essential (primary) hypertension [I10]                 PVC (premature ventricular contraction) [I49.3]  History:        Patient has no prior history of Echocardiogram examinations.  Sonographer:    Luane School RDCS Referring Phys: [id number] [facility] [place] IMPRESSIONS  1. GLS -8.2. Left ventricular  ejection fraction, by estimation, is 60 to 65%. The left ventricle has normal function. The left ventricle has no regional wall motion abnormalities. There is moderate left ventricular hypertrophy. Left ventricular diastolic parameters are consistent with Grade II diastolic dysfunction (pseudonormalization).  2. Right ventricular systolic function  is normal. The right ventricular size is normal. There is normal pulmonary artery systolic pressure.  3. The mitral valve is normal in structure. Trivial mitral valve regurgitation. No evidence of mitral stenosis.  4. The aortic valve is normal in structure. Aortic valve regurgitation is not visualized. No aortic stenosis is present.  5. The inferior vena cava is normal in size with greater than 50% respiratory variability, suggesting right atrial pressure of 3 mmHg. FINDINGS  Left Ventricle: GLS -8.2. Left ventricular ejection fraction, by estimation, is 60 to 65%. The left ventricle has normal function. The left ventricle has no regional wall motion abnormalities. The left ventricular internal cavity size was normal in size. There is moderate left ventricular hypertrophy. Left ventricular diastolic parameters are consistent with Grade II diastolic dysfunction (pseudonormalization). Right Ventricle: The right ventricular size is normal. No increase in right ventricular wall thickness. Right ventricular systolic function is normal. There is normal pulmonary artery systolic pressure. The tricuspid regurgitant velocity is 1.44 m/s, and  with an assumed right atrial pressure of 3 mmHg, the estimated right ventricular systolic pressure is 38.9 mmHg. Left Atrium: Left atrial size was normal in size. Right Atrium: Right atrial size was normal in size. Pericardium: There is no evidence of pericardial effusion. Mitral Valve: The mitral valve is normal in structure. Trivial mitral valve regurgitation. No evidence of mitral valve stenosis. Tricuspid Valve: The tricuspid valve is  normal in structure. Tricuspid valve regurgitation is not demonstrated. No evidence of tricuspid stenosis. Aortic Valve: The aortic valve is normal in structure. Aortic valve regurgitation is not visualized. No

## 2021-06-25 ENCOUNTER — Encounter
Admission: RE | Disposition: A | Discharge status: Home / Self Care | Payer: Self-pay | Source: Home / Self Care | Attending: Gastroenterology

## 2021-06-25 ENCOUNTER — Encounter: Payer: Self-pay | Admitting: Gastroenterology

## 2021-06-25 ENCOUNTER — Ambulatory Visit: Payer: BC Managed Care – PPO | Admitting: Certified Registered"

## 2021-06-25 ENCOUNTER — Ambulatory Visit
Admission: RE | Admit: 2021-06-25 | Discharge: 2021-06-25 | Disposition: A | Discharge status: Home / Self Care | Payer: BC Managed Care – PPO | Attending: Gastroenterology | Admitting: Gastroenterology

## 2021-06-25 DIAGNOSIS — E785 Hyperlipidemia, unspecified: Secondary | ICD-10-CM | POA: Diagnosis not present

## 2021-06-25 DIAGNOSIS — K259 Gastric ulcer, unspecified as acute or chronic, without hemorrhage or perforation: Secondary | ICD-10-CM | POA: Insufficient documentation

## 2021-06-25 DIAGNOSIS — R1012 Left upper quadrant pain: Secondary | ICD-10-CM | POA: Insufficient documentation

## 2021-06-25 DIAGNOSIS — E0590 Thyrotoxicosis, unspecified without thyrotoxic crisis or storm: Secondary | ICD-10-CM | POA: Insufficient documentation

## 2021-06-25 DIAGNOSIS — E119 Type 2 diabetes mellitus without complications: Secondary | ICD-10-CM | POA: Insufficient documentation

## 2021-06-25 DIAGNOSIS — F419 Anxiety disorder, unspecified: Secondary | ICD-10-CM | POA: Insufficient documentation

## 2021-06-25 DIAGNOSIS — M797 Fibromyalgia: Secondary | ICD-10-CM | POA: Insufficient documentation

## 2021-06-25 DIAGNOSIS — I10 Essential (primary) hypertension: Secondary | ICD-10-CM | POA: Insufficient documentation

## 2021-06-25 DIAGNOSIS — R109 Unspecified abdominal pain: Secondary | ICD-10-CM

## 2021-06-25 DIAGNOSIS — F32A Depression, unspecified: Secondary | ICD-10-CM | POA: Diagnosis not present

## 2021-06-25 HISTORY — PX: ESOPHAGOGASTRODUODENOSCOPY (EGD) WITH PROPOFOL: SHX5813

## 2021-06-25 SURGERY — ESOPHAGOGASTRODUODENOSCOPY (EGD) WITH PROPOFOL
Anesthesia: General

## 2021-06-25 MED ORDER — PROPOFOL 10 MG/ML IV BOLUS
INTRAVENOUS | Status: DC | PRN
Start: 2021-06-25 — End: 2021-06-25
  Administered 2021-06-25: 70 mg via INTRAVENOUS
  Administered 2021-06-25: 30 mg via INTRAVENOUS

## 2021-06-25 MED ORDER — MIDAZOLAM HCL 2 MG/2ML IJ SOLN
INTRAMUSCULAR | Status: AC
  Filled 2021-06-25: qty 2

## 2021-06-25 MED ORDER — MIDAZOLAM HCL 2 MG/2ML IJ SOLN
INTRAMUSCULAR | Status: DC | PRN
  Administered 2021-06-25: 2 mg via INTRAVENOUS

## 2021-06-25 MED ORDER — PROPOFOL 500 MG/50ML IV EMUL
INTRAVENOUS | Status: DC | PRN
  Administered 2021-06-25: 145 ug/kg/min via INTRAVENOUS

## 2021-06-25 MED ORDER — HYDRALAZINE HCL 20 MG/ML IJ SOLN
10.0000 mg | Freq: Once | INTRAMUSCULAR | Status: AC
  Administered 2021-06-25: 10 mg via INTRAVENOUS

## 2021-06-25 MED ORDER — LIDOCAINE HCL (CARDIAC) PF 100 MG/5ML IV SOSY
PREFILLED_SYRINGE | INTRAVENOUS | Status: DC | PRN
  Administered 2021-06-25: 100 mg via INTRAVENOUS

## 2021-06-25 MED ORDER — GLYCOPYRROLATE 0.2 MG/ML IJ SOLN
INTRAMUSCULAR | Status: DC | PRN
Start: 2021-06-25 — End: 2021-06-25
  Administered 2021-06-25: .2 mg via INTRAVENOUS

## 2021-06-25 MED ORDER — SODIUM CHLORIDE 0.9 % IV SOLN: INTRAVENOUS | Status: DC

## 2021-06-25 NOTE — Transfer of Care (Signed)
Immediate Anesthesia Transfer of Care Note ? ?Patient: Alexandra Good ? ?Procedure(s) Performed: ESOPHAGOGASTRODUODENOSCOPY (EGD) WITH PROPOFOL ? ?Patient Location: Endoscopy Unit ? ?Anesthesia Type:General ? ?Level of Consciousness: awake, drowsy and patient cooperative ? ?Airway & Oxygen Therapy: Patient Spontanous Breathing and Patient connected to face mask oxygen ? ?Post-op Assessment: Report given to RN and Post -op Vital signs reviewed and stable, bigeminy noted post-op, MDA notified and will cont to monitor [facility] CRNA  ? ?Post vital signs: Reviewed and stable ? ?Last Vitals:  ?Vitals Value Taken Time  ?BP    ?Temp    ?Pulse 51 06/25/21 1142  ?Resp 21 06/25/21 1142  ?SpO2 100 % 06/25/21 1142  ?Vitals shown include unvalidated device data. ? ?Last Pain:  ?Vitals:  ? 06/25/21 1100  ?TempSrc: Temporal  ?   ? ?  ? ?Complications: No notable events documented. ?

## 2021-06-25 NOTE — H&P (Signed)
? ? ? ?Jonathon Bellows, MD ?[address] [place], [place], [place], [address] ?[address] [place], [place], [place], [address] ?Phone: [phone]  ?Fax: [phone] ? ?Primary Care Physician:  Zoila Shutter, NP ? ? ?Pre-Procedure History & Physical: ?HPI:  Alexandra Good is a 56 y.o. female is here for an endoscopy  ?  ?Past Medical History:  ?Diagnosis Date  ? Anxiety   ? Chest pain   ? Chronic pain disorder   ? DDD (degenerative disc disease), lumbar   ? Depression   ? Essential (primary) hypertension   ? Fibromyalgia   ? HTN (hypertension)   ? Hyperlipidemia   ? Hyperthyroidism   ? Insomnia   ? Mixed connective tissue disease ([place])   ? Mixed hyperlipidemia   ? Palpitations   ? Palpitations   ? PVC (premature ventricular contraction) 05/25/2021  ? Raynaud's syndrome   ? Systemic involvement of connective tissue ([place])   ? Type 2 diabetes mellitus without complications ([place])   ? Vitamin B12 deficiency   ? Vitamin D deficiency   ? ? ?Past Surgical History:  ?Procedure Laterality Date  ? ANTERIOR CRUCIATE LIGAMENT REPAIR Right 2004  ? right hip replacement Right 11/18/2013  ? TUBAL LIGATION  1991  ? ? ?Prior to Admission medications   ?Medication Sig Start Date End Date Taking? Authorizing Provider  ?cloNIDine (CATAPRES) 0.1 MG tablet 1 tablet when diastolic is above 90 mmHg 04/22/21  Yes [provider]  ?gabapentin (NEURONTIN) 300 MG capsule Take 300 mg by mouth 2 (two) times daily.   Yes [provider]  ?hydroxychloroquine (PLAQUENIL) 200 MG tablet Take 200 mg by mouth 2 (two) times daily.   Yes [provider]  ?levothyroxine (SYNTHROID) 75 MCG tablet Take 75 mcg by mouth daily before breakfast.   Yes [provider]  ?valsartan-hydrochlorothiazide (DIOVAN-HCT) 320-25 MG tablet Take 1 tablet by mouth daily. 05/25/21  Yes Skeet Latch, MD  ?ALPRAZolam Duanne Moron) 0.5 MG tablet Take 0.5 mg by mouth 2 (two) times daily.    [provider]  ?carvedilol (COREG) 6.25 MG  tablet Take 1 tablet (6.25 mg total) by mouth 2 (two) times daily. ?Patient not taking: Reported on 06/25/2021 05/25/21   Skeet Latch, MD  ?diclofenac Sodium (VOLTAREN) 1 % GEL Apply 1 application. topically 2 (two) times daily as needed for pain.    [provider]  ?ibuprofen (ADVIL) 800 MG tablet Take 800 mg by mouth 3 (three) times daily as needed for pain.    [provider]  ?omeprazole (PRILOSEC) 40 MG capsule Take 1 capsule (40 mg total) by mouth daily. 06/24/21   Jonathon Bellows, MD  ?tiZANidine (ZANAFLEX) 4 MG tablet Take 4 mg by mouth 3 (three) times daily as needed for muscle spasms.    [provider]  ?topiramate (TOPAMAX) 100 MG tablet Take 100 mg by mouth daily.    [provider]  ?valACYclovir (VALTREX) 1000 MG tablet Take 1,000 mg by mouth daily.    [provider]  ? ? ?Allergies as of 06/24/2021 - Review Complete 06/24/2021  ?Allergen Reaction Noted  ? Amlodipine besylate  05/10/2021  ? Crestor [rosuvastatin]  04/22/2021  ? ? ?Family History  ?Problem Relation Age of Onset  ? Coronary artery disease Mother   ? Atrial fibrillation Mother   ? Heart attack Father 33  ? Stroke Maternal Aunt   ? Heart attack Maternal Uncle   ? ? ?Social History  ? ?Socioeconomic History  ?  Marital status: Married  ?  Spouse name: Not on file  ? Number of children: Not on file  ? Years of education: Not on file  ? Highest education level: Not on file  ?Occupational History  ? Not on file  ?Tobacco Use  ? Smoking status: Never  ? Smokeless tobacco: Never  ?Substance and Sexual Activity  ? Alcohol use: Not on file  ? Drug use: Not on file  ? Sexual activity: Not on file  ?Other Topics Concern  ? Not on file  ?Social History Narrative  ? ** Merged History Encounter **  ?    ? ?Social Determinants of Health  ? ?Financial Resource Strain: Low Risk   ? Difficulty of Paying Living Expenses: Not hard at all  ?Food Insecurity: No Food Insecurity  ? Worried About Charity fundraiser in  the Last Year: Never true  ? Ran Out of Food in the Last Year: Never true  ?Transportation Needs: No Transportation Needs  ? Lack of Transportation (Medical): No  ? Lack of Transportation (Non-Medical): No  ?Physical Activity: Inactive  ? Days of Exercise per Week: 0 days  ? Minutes of Exercise per Session: 0 min  ?Stress: Not on file  ?Social Connections: Not on file  ?Intimate Partner Violence: Not on file  ? ? ?Review of Systems: ?See HPI, otherwise negative ROS ? ?Physical Exam: ?There were no vitals taken for this visit. ?General:   Alert,  pleasant and cooperative in NAD ?Head:  Normocephalic and atraumatic. ?Neck:  Supple; no masses or thyromegaly. ?Lungs:  Clear throughout to auscultation, normal respiratory effort.    ?Heart:  +S1, +S2, Regular rate and rhythm, No edema. ?Abdomen:  Soft, nontender and nondistended. Normal bowel sounds, without guarding, and without rebound.   ?Neurologic:  Alert and  oriented x4;  grossly normal neurologically. ? ?Impression/Plan: ?Alexandra Good is here for an endoscopy  to be performed for  evaluation of abdominal pain . ?   ?Risks, benefits, limitations, and alternatives regarding endoscopy have been reviewed with the patient.  Questions have been answered.  All parties agreeable. ? ? ?Jonathon Bellows, MD  06/25/2021, 10:35 AM ? ?

## 2021-06-25 NOTE — Anesthesia Preprocedure Evaluation (Signed)
Anesthesia Evaluation  ?Patient identified by MRN, date of birth, ID band ?Patient awake ? ? ? ?Reviewed: ?Allergy & Precautions, NPO status , Patient's Chart, lab work & pertinent test results ? ?History of Anesthesia Complications ?Negative for: history of anesthetic complications ? ?Airway ?Mallampati: III ? ?TM Distance: >3 FB ?Neck ROM: full ? ? ? Dental ? ?(+) Chipped ?  ?Pulmonary ?neg shortness of breath, sleep apnea ,  ?  ?Pulmonary exam normal ? ? ? ? ? ? ? Cardiovascular ?Exercise Tolerance: Good ?hypertension, (-) anginaNormal cardiovascular exam ? ? ?  ?Neuro/Psych ?PSYCHIATRIC DISORDERS  Neuromuscular disease   ? GI/Hepatic ?negative GI ROS, Neg liver ROS, neg GERD  ,  ?Endo/Other  ?diabetes, Type 2Hyperthyroidism  ? Renal/GU ?negative Renal ROS  ?negative genitourinary ?  ?Musculoskeletal ? ?(+) Arthritis , Fibromyalgia - ? Abdominal ?  ?Peds ? Hematology ?negative hematology ROS ?(+)   ?Anesthesia Other Findings ?Past Medical History: ?No date: Anxiety ?No date: Chest pain ?No date: Chronic pain disorder ?No date: DDD (degenerative disc disease), lumbar ?No date: Depression ?No date: Essential (primary) hypertension ?No date: Fibromyalgia ?No date: HTN (hypertension) ?No date: Hyperlipidemia ?No date: Hyperthyroidism ?No date: Insomnia ?No date: Mixed connective tissue disease ([place]) ?No date: Mixed hyperlipidemia ?No date: Palpitations ?No date: Palpitations ?05/25/2021: PVC (premature ventricular contraction) ?No date: Raynaud's syndrome ?No date: Systemic involvement of connective tissue ([place]) ?No date: Type 2 diabetes mellitus without complications ([place]) ?No date: Vitamin B12 deficiency ?No date: Vitamin D deficiency ? ?Past Surgical History: ?2004: ANTERIOR CRUCIATE LIGAMENT REPAIR; Right ?11/18/2013: right hip replacement; Right ?1991: TUBAL LIGATION ? ?BMI   ? Body Mass Index: 39.87 kg/m?  ?  ? ? Reproductive/Obstetrics ?negative OB ROS ? ?   ? ? ? ? ? ? ? ? ? ? ? ? ? ?  ?  ? ? ? ? ? ? ? ? ?Anesthesia Physical ?Anesthesia Plan ? ?ASA: 3 ? ?Anesthesia Plan: General  ? ?Post-op Pain Management:   ? ?Induction: Intravenous ? ?PONV Risk Score and Plan: Propofol infusion and TIVA ? ?Airway Management Planned: Natural Airway and Nasal Cannula ? ?Additional Equipment:  ? ?Intra-op Plan:  ? ?Post-operative Plan:  ? ?Informed Consent: I have reviewed the patients History and Physical, chart, labs and discussed the procedure including the risks, benefits and alternatives for the proposed anesthesia with the patient or authorized representative who has indicated his/her understanding and acceptance.  ? ? ? ?Dental Advisory Given ? ?Plan Discussed with: Anesthesiologist, CRNA and Surgeon ? ?Anesthesia Plan Comments: (Hypertensive but asymptomatic in preOp, treating BP before initialing anesthetic  ? ?Patient consented for risks of anesthesia including but not limited to:  ?- adverse reactions to medications ?- risk of airway placement if required ?- damage to eyes, teeth, lips or other oral mucosa ?- nerve damage due to positioning  ?- sore throat or hoarseness ?- Damage to heart, brain, nerves, lungs, other parts of body or loss of life ? ?Patient voiced understanding.)  ? ? ? ? ? ? ?Anesthesia Quick Evaluation ? ?

## 2021-06-25 NOTE — Op Note (Signed)
[facility] ?Gastroenterology ?Patient Name: Alexandra Good ?Procedure Date: 06/25/2021 11:25 AM ?MRN: [id number] ?Account #: [id number] ?Date of Birth: [birth year] ?Admit Type: Outpatient ?Age: 56 ?Room: [facility] ENDO ROOM 4 ?Gender: Female ?Note Status: Finalized ?Instrument Name: Upper Endoscope [id number] ?Procedure:             Upper GI endoscopy ?Indications:           Abdominal pain in the left upper quadrant ?Providers:             Jonathon Bellows MD, MD ?Referring MD:          Orlando Penner. Iona Beard (Referring MD) ?Medicines:             Monitored Anesthesia Care ?Complications:         No immediate complications. ?Procedure:             Pre-Anesthesia Assessment: ?                       - Prior to the procedure, a History and Physical was  ?                       performed, and patient medications, allergies and  ?                       sensitivities were reviewed. The patient's tolerance  ?                       of previous anesthesia was reviewed. ?                       - The risks and benefits of the procedure and the  ?                       sedation options and risks were discussed with the  ?                       patient. All questions were answered and informed  ?                       consent was obtained. ?                       - ASA Grade Assessment: II - A patient with mild  ?                       systemic disease. ?                       After obtaining informed consent, the endoscope was  ?                       passed under direct vision. Throughout the procedure,  ?                       the patient's blood pressure, pulse, and oxygen  ?                       saturations were monitored continuously. The Endoscope  ?  was introduced through the mouth, and advanced to the  ?                       third part of duodenum. The upper GI endoscopy was  ?                       accomplished with ease. The patient tolerated the  ?                       procedure well. ?Findings: ?      The esophagus was normal. ?     The examined duodenum was normal. ?     Two non-bleeding superficial gastric ulcers with no stigmata of bleeding  ?     were found in the prepyloric region of the stomach. The largest lesion  ?     was 8 mm in largest dimension. Biopsies were taken with a cold forceps  ?     for histology. ?     The cardia and gastric fundus were normal on retroflexion. ?Impression:            - Normal esophagus. ?                       - Normal examined duodenum. ?                       - Non-bleeding gastric ulcers with no stigmata of  ?                       bleeding. Biopsied. ?Recommendation:        - Await pathology results. ?                       - Discharge patient to home (with escort). ?                       - Resume previous diet. ?                       - Continue present medications. ?                       - Return to my office as previously scheduled. ?Procedure Code(s):     --- Professional --- ?                       [phone], Esophagogastroduodenoscopy, flexible,  ?                       transoral; with biopsy, single or multiple ?Diagnosis Code(s):     --- Professional --- ?                       K25.9, Gastric ulcer, unspecified as acute or chronic,  ?                       without hemorrhage or perforation ?                       R10.12, Left upper quadrant pain ?CPT copyright 2019 American Medical Association. All rights reserved. ?The codes documented in this report are preliminary and upon  coder review may  ?be revised to meet current compliance requirements. ?Jonathon Bellows, MD ?Jonathon Bellows MD, MD ?06/25/2021 11:35:47 AM ?This report has been signed electronically. ?Number of Addenda: 0 ?Note Initiated On: 06/25/2021 11:25 AM ?Estimated Blood Loss:  Estimated blood loss: none. ?     [facility] ?

## 2021-06-25 NOTE — Anesthesia Procedure Notes (Signed)
Procedure Name: General with mask airway ?Date/Time: 06/25/2021 11:40 AM ?Performed by: Kelton Pillar, CRNA ?Pre-anesthesia Checklist: Patient identified, Emergency Drugs available, Suction available and Patient being monitored ?Patient Re-evaluated:Patient Re-evaluated prior to induction ?Oxygen Delivery Method: Simple face mask ?Induction Type: IV induction ?Placement Confirmation: positive ETCO2, CO2 detector and breath sounds checked- equal and bilateral ?Dental Injury: Teeth and Oropharynx as per pre-operative assessment  ? ? ? ? ?

## 2021-06-25 NOTE — Anesthesia Postprocedure Evaluation (Signed)
Anesthesia Post Note ? ?Patient: Alexandra Good ? ?Procedure(s) Performed: ESOPHAGOGASTRODUODENOSCOPY (EGD) WITH PROPOFOL ? ?Patient location during evaluation: Endoscopy ?Anesthesia Type: General ?Level of consciousness: awake and alert ?Pain management: pain level controlled ?Vital Signs Assessment: post-procedure vital signs reviewed and stable ?Respiratory status: spontaneous breathing, nonlabored ventilation, respiratory function stable and patient connected to nasal cannula oxygen ?Cardiovascular status: blood pressure returned to baseline and stable ?Postop Assessment: no apparent nausea or vomiting ?Anesthetic complications: no ? ? ?No notable events documented. ? ? ?Last Vitals:  ?Vitals:  ? 06/25/21 1151 06/25/21 1201  ?BP: (!) 174/78 (!) 188/86  ?Pulse:  98  ?Resp:    ?Temp:    ?SpO2:  98%  ?  ?Last Pain:  ?Vitals:  ? 06/25/21 1201  ?TempSrc:   ?PainSc: 0-No pain  ? ? ?  ?  ?  ?  ?  ?  ? ?Precious Haws [name] ? ? ? ? ?

## 2021-06-26 LAB — H. PYLORI BREATH TEST: H pylori Breath Test: NEGATIVE

## 2021-06-28 ENCOUNTER — Telehealth: Payer: Self-pay | Admitting: Gastroenterology

## 2021-06-28 ENCOUNTER — Telehealth: Payer: Self-pay

## 2021-06-28 ENCOUNTER — Encounter: Payer: Self-pay | Admitting: Gastroenterology

## 2021-06-28 LAB — SURGICAL PATHOLOGY

## 2021-06-28 NOTE — Telephone Encounter (Signed)
Patient left vm requesting a call back. Has questions about procedure and bowel prep.  ?

## 2021-06-28 NOTE — Telephone Encounter (Signed)
Patient had question I answered them all  ?

## 2021-06-29 ENCOUNTER — Encounter: Payer: Self-pay | Admitting: Gastroenterology

## 2021-06-29 NOTE — Progress Notes (Signed)
?Electrophysiology Office Note:   ? ?Date:  06/30/2021  ? ?ID:  Alexandra Good, DOB Oct 18, 1965, MRN [id number] ? ?PCP:  Zoila Shutter, NP  ?[facility] Cardiologist:  None  ?[facility] Electrophysiologist:  Vickie Epley, MD  ? ?Referring MD: Skeet Latch, MD  ? ?Chief Complaint: PVC ? ?History of Present Illness:   ? ?Alexandra Good is a 56 y.o. female who presents for an evaluation of PVC at the request of Dr Oval Linsey. Their medical history includes HTN, Hyperthyroidism, palpitations. She was seen by Dr Oval Linsey 05/25/2021 for palpitations. This visit was after an ER visit 05/20/2021. During the ER visit an ECG shows frequent PVCs. At the recent visit she reported palpitations, fatigue and chest pressure. She does snore. Coreg was started and an echo was ordered.  ? ?Today she tells me she has had struggles with her blood pressure for years.  She snores at night but has never had a sleep study.  She recently stopped her Coreg and Diovan because of concerns of swelling all over her body.  She restarted her lisinopril 20 mg by mouth once daily and hydrochlorothiazide 25 mg by mouth once daily. ? ?With her PVCs, she feels palpitations and chest discomfort at times. ?  ?Past Medical History:  ?Diagnosis Date  ? Anxiety   ? Chest pain   ? Chronic pain disorder   ? DDD (degenerative disc disease), lumbar   ? Depression   ? Essential (primary) hypertension   ? Fibromyalgia   ? HTN (hypertension)   ? Hyperlipidemia   ? Hyperthyroidism   ? Insomnia   ? Mixed connective tissue disease ([place])   ? Mixed hyperlipidemia   ? Palpitations   ? Palpitations   ? PVC (premature ventricular contraction) 05/25/2021  ? Raynaud's syndrome   ? Systemic involvement of connective tissue ([place])   ? Type 2 diabetes mellitus without complications ([place])   ? Vitamin B12 deficiency   ? Vitamin D deficiency   ? ? ?Past Surgical History:  ?Procedure Laterality Date  ? ANTERIOR CRUCIATE LIGAMENT REPAIR Right 2004  ? ESOPHAGOGASTRODUODENOSCOPY  (EGD) WITH PROPOFOL N/A 06/25/2021  ? Procedure: ESOPHAGOGASTRODUODENOSCOPY (EGD) WITH PROPOFOL;  Surgeon: Jonathon Bellows, MD;  [facility] ENDOSCOPY;  Service: Gastroenterology;  Laterality: N/A;  ? right hip replacement Right 11/18/2013  ? TUBAL LIGATION  1991  ? ? ?Current Medications: ?Current Meds  ?Medication Sig  ? ALPRAZolam (XANAX) 0.5 MG tablet Take 0.5 mg by mouth 2 (two) times daily.  ? cloNIDine (CATAPRES) 0.1 MG tablet Take 1 tablet (0.1 mg total) by mouth 2 (two) times daily.  ? diclofenac Sodium (VOLTAREN) 1 % GEL Apply 1 application. topically 2 (two) times daily as needed for pain.  ? gabapentin (NEURONTIN) 300 MG capsule Take 300 mg by mouth 2 (two) times daily.  ? hydrochlorothiazide (HYDRODIURIL) 25 MG tablet Take 50 mg by mouth daily.  ? hydroxychloroquine (PLAQUENIL) 200 MG tablet Take 200 mg by mouth 2 (two) times daily.  ? ibuprofen (ADVIL) 800 MG tablet Take 800 mg by mouth 3 (three) times daily as needed for pain.  ? levothyroxine (SYNTHROID) 75 MCG tablet Take 75 mcg by mouth daily before breakfast.  ? lisinopril (ZESTRIL) 40 MG tablet Take 1 tablet (40 mg total) by mouth daily.  ? omeprazole (PRILOSEC) 40 MG capsule Take 1 capsule (40 mg total) by mouth daily.  ? propafenone (RYTHMOL SR) 225 MG 12 hr capsule Take 1 capsule (225 mg total) by mouth 2 (two) times  daily.  ? tiZANidine (ZANAFLEX) 4 MG tablet Take 4 mg by mouth 3 (three) times daily as needed for muscle spasms.  ? topiramate (TOPAMAX) 100 MG tablet Take 100 mg by mouth daily.  ? valACYclovir (VALTREX) 1000 MG tablet Take 1,000 mg by mouth daily.  ? [DISCONTINUED] cloNIDine (CATAPRES) 0.1 MG tablet 1 tablet when diastolic is above 90 mmHg  ? [DISCONTINUED] lisinopril (ZESTRIL) 20 MG tablet Take 20 mg by mouth daily.  ?  ? ?Allergies:   Amlodipine besylate, Carvedilol, Crestor [rosuvastatin], and Valsartan-hydrochlorothiazide  ? ?Social History  ? ?Socioeconomic History  ? Marital status: Married  ?  Spouse name: Not on file  ?  Number of children: Not on file  ? Years of education: Not on file  ? Highest education level: Not on file  ?Occupational History  ? Not on file  ?Tobacco Use  ? Smoking status: Never  ? Smokeless tobacco: Never  ?Vaping Use  ? Vaping Use: Never used  ?Substance and Sexual Activity  ? Alcohol use: Never  ? Drug use: Never  ? Sexual activity: Not on file  ?Other Topics Concern  ? Not on file  ?Social History Narrative  ? ** Merged History Encounter **  ?    ? ?Social Determinants of Health  ? ?Financial Resource Strain: Low Risk   ? Difficulty of Paying Living Expenses: Not hard at all  ?Food Insecurity: No Food Insecurity  ? Worried About Charity fundraiser in the Last Year: Never true  ? Ran Out of Food in the Last Year: Never true  ?Transportation Needs: No Transportation Needs  ? Lack of Transportation (Medical): No  ? Lack of Transportation (Non-Medical): No  ?Physical Activity: Inactive  ? Days of Exercise per Week: 0 days  ? Minutes of Exercise per Session: 0 min  ?Stress: Not on file  ?Social Connections: Not on file  ?  ? ?Family History: ?The patient's family history includes Atrial fibrillation in her mother; Coronary artery disease in her mother; Heart attack in her maternal uncle; Heart attack (age of onset: 76) in her father; Stroke in her maternal aunt. ? ?ROS:   ?Please see the history of present illness.    ?All other systems reviewed and are negative. ? ?EKGs/Labs/Other Studies Reviewed:   ? ?The following studies were reviewed today: ? ?06/01/2021 Echo ?EF 60% ?RV normal ?Trivial MR ?No AS ? ?05/27/2021 ECG ?Sinus with frequent PVCs. PVC are monomorphic. Steeply inferior axis. QRS transitions in V3. ? ? ? ?04/04/2017 SPECT at [facility] ?IMPRESSION:  ?1. No reversible ischemia or infarction.  ? ?2. Normal left ventricular wall motion.  ? ?3. Left ventricular ejection fraction 64%  ? ?4. Non invasive risk stratification*: Low risk.  ? ?Recent Labs: ?06/01/2021: BUN 10; Creatinine, Ser 0.74; Potassium 4.3;  Sodium 145  ?Recent Lipid Panel ?No results found for: CHOL, TRIG, HDL, CHOLHDL, VLDL, LDLCALC, LDLDIRECT ? ?Physical Exam:   ? ?VS:  BP (!) 182/102   Pulse 86   Ht '5\' 2"'$  (1.575 m)   Wt 212 lb (96.2 kg)   SpO2 96%   BMI 38.78 kg/m?    ? ?Wt Readings from Last 3 Encounters:  ?06/30/21 212 lb (96.2 kg)  ?06/25/21 218 lb (98.9 kg)  ?06/24/21 218 lb (98.9 kg)  ?  ? ?GEN:  Well nourished, well developed in no acute distress.  Obese ?HEENT: Normal ?NECK: No JVD; No carotid bruits ?LYMPHATICS: No lymphadenopathy ?CARDIAC: RRR, no murmurs, rubs, gallops ?RESPIRATORY:  Clear  to auscultation without rales, wheezing or rhonchi  ?ABDOMEN: Soft, non-tender, non-distended ?MUSCULOSKELETAL:  No edema; No deformity  ?SKIN: Warm and dry ?NEUROLOGIC:  Alert and oriented x 3 ?PSYCHIATRIC:  Normal affect  ? ? ?  ? ?ASSESSMENT:   ? ?1. PVC (premature ventricular contraction)   ?2. Essential (primary) hypertension   ? ?PLAN:   ? ?In order of problems listed above: ? ?#Hypertension ?Poorly controlled.  Today we will increase her lisinopril to 40 mg by mouth once daily.  I will also change her clonidine to scheduled twice daily 0.1 mg by mouth.  I will refer her to the pharmacy clinic for further adjustment of her antihypertensive regimen.  Consider increasing clonidine and changing hydrochlorothiazide to chlorthalidone.  Could also consider adding spironolactone. ? ?I will also check a sleep study to assess for sleep apnea potentially contributing to her poorly controlled hypertension. ? ? ?#PVC ?Symptomatic.  Arising from the outflow tract.  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  I discussed catheter ablation and antiarrhythmic drug therapy during today's appointment.  Given her uncontrolled blood pressure, I do not think catheter ablation is the right for step.  I will initiate propafenone to 25 mg by mouth twice daily.  We will get an EKG in 5 to 7 days.  If her  blood pressure trends down, could reconsider EP study and possible ablation. ? ? ?I will plan to see her back in 3 months or sooner as needed. ? ? ? ?Medication Adjustments/Labs and Tests Ordered: ?Current medicines

## 2021-06-30 ENCOUNTER — Encounter: Payer: Self-pay | Admitting: Cardiology

## 2021-06-30 ENCOUNTER — Encounter: Payer: Self-pay | Admitting: Gastroenterology

## 2021-06-30 ENCOUNTER — Ambulatory Visit: Payer: BC Managed Care – PPO | Admitting: Cardiology

## 2021-06-30 VITALS — BP 182/102 | HR 86 | Ht 62.0 in | Wt 212.0 lb

## 2021-06-30 DIAGNOSIS — R0683 Snoring: Secondary | ICD-10-CM | POA: Diagnosis not present

## 2021-06-30 DIAGNOSIS — I493 Ventricular premature depolarization: Secondary | ICD-10-CM

## 2021-06-30 DIAGNOSIS — R5383 Other fatigue: Secondary | ICD-10-CM | POA: Diagnosis not present

## 2021-06-30 DIAGNOSIS — I10 Essential (primary) hypertension: Secondary | ICD-10-CM

## 2021-06-30 MED ORDER — LISINOPRIL 40 MG PO TABS: 40.0000 mg | ORAL_TABLET | Freq: Every day | ORAL | 3 refills | Status: DC

## 2021-06-30 MED ORDER — CLONIDINE HCL 0.1 MG PO TABS: 0.1000 mg | ORAL_TABLET | Freq: Two times a day (BID) | ORAL | 3 refills | Status: DC

## 2021-06-30 MED ORDER — PROPAFENONE HCL ER 225 MG PO CP12: 225.0000 mg | ORAL_CAPSULE | Freq: Two times a day (BID) | ORAL | 3 refills | Status: AC

## 2021-06-30 NOTE — Patient Instructions (Addendum)
Medications: ?Start Clonidine 0.1 mg two times daily.  ?Increase Lisinopril to 40 mg daily. ?Start Propafenone 225 mg two times daily.  ?Your physician recommends that you continue on your current medications as directed. Please refer to the Current Medication list given to you today. ?*If you need a refill on your cardiac medications before your next appointment, please call your pharmacy* ? ?Lab Work: ?None. ?If you have labs (blood work) drawn today and your tests are completely normal, you will receive your results only by: ?MyChart Message (if you have MyChart) OR ?A paper copy in the mail ?If you have any lab test that is abnormal or we need to change your treatment, we will call you to review the results. ? ?Testing/Procedures: ?Your physician has recommended that you have a sleep study. This test records several body functions during sleep, including: brain activity, eye movement, oxygen and carbon dioxide blood levels, heart rate and rhythm, breathing rate and rhythm, the flow of air through your mouth and nose, snoring, body muscle movements, and chest and belly movement.  ? ?Follow-Up: ?At [facility], you and your health needs are our priority.  As part of our continuing mission to provide you with exceptional heart care, we have created designated Provider Care Teams.  These Care Teams include your primary Cardiologist (physician) and Advanced Practice Providers (APPs -  Physician Assistants and Nurse Practitioners) who all work together to provide you with the care you need, when you need it. ? ?Your physician wants you to follow-up in: 7-10 day Nurse visit with EKG and 3 months with Lars Mage, MD  ? ?We recommend signing up for the patient portal called "MyChart".  Sign up information is provided on this After Visit Summary.  MyChart is used to connect with patients for Virtual Visits (Telemedicine).  Patients are able to view lab/test results, encounter notes, upcoming appointments, etc.   Non-urgent messages can be sent to your provider as well.   ?To learn more about what you can do with MyChart, go to [email or web address].   ? ?Any Other Special Instructions Will Be Listed Below (If Applicable).  ? ? ?

## 2021-07-01 ENCOUNTER — Ambulatory Visit
Admission: RE | Admit: 2021-07-01 | Discharge: 2021-07-01 | Disposition: A | Discharge status: Home / Self Care | Payer: BC Managed Care – PPO | Attending: Gastroenterology | Admitting: Gastroenterology

## 2021-07-01 ENCOUNTER — Ambulatory Visit: Payer: BC Managed Care – PPO | Admitting: Anesthesiology

## 2021-07-01 ENCOUNTER — Encounter
Admission: RE | Disposition: A | Discharge status: Home / Self Care | Payer: Self-pay | Source: Home / Self Care | Attending: Gastroenterology

## 2021-07-01 DIAGNOSIS — D125 Benign neoplasm of sigmoid colon: Secondary | ICD-10-CM | POA: Diagnosis not present

## 2021-07-01 DIAGNOSIS — F32A Depression, unspecified: Secondary | ICD-10-CM | POA: Insufficient documentation

## 2021-07-01 DIAGNOSIS — K635 Polyp of colon: Secondary | ICD-10-CM

## 2021-07-01 DIAGNOSIS — M1990 Unspecified osteoarthritis, unspecified site: Secondary | ICD-10-CM | POA: Diagnosis not present

## 2021-07-01 DIAGNOSIS — I7300 Raynaud's syndrome without gangrene: Secondary | ICD-10-CM | POA: Insufficient documentation

## 2021-07-01 DIAGNOSIS — E119 Type 2 diabetes mellitus without complications: Secondary | ICD-10-CM | POA: Diagnosis not present

## 2021-07-01 DIAGNOSIS — I10 Essential (primary) hypertension: Secondary | ICD-10-CM | POA: Insufficient documentation

## 2021-07-01 DIAGNOSIS — K5730 Diverticulosis of large intestine without perforation or abscess without bleeding: Secondary | ICD-10-CM | POA: Diagnosis not present

## 2021-07-01 DIAGNOSIS — E0590 Thyrotoxicosis, unspecified without thyrotoxic crisis or storm: Secondary | ICD-10-CM | POA: Diagnosis not present

## 2021-07-01 DIAGNOSIS — K219 Gastro-esophageal reflux disease without esophagitis: Secondary | ICD-10-CM | POA: Insufficient documentation

## 2021-07-01 DIAGNOSIS — F419 Anxiety disorder, unspecified: Secondary | ICD-10-CM | POA: Insufficient documentation

## 2021-07-01 DIAGNOSIS — E782 Mixed hyperlipidemia: Secondary | ICD-10-CM | POA: Insufficient documentation

## 2021-07-01 DIAGNOSIS — M797 Fibromyalgia: Secondary | ICD-10-CM | POA: Diagnosis not present

## 2021-07-01 DIAGNOSIS — Z1211 Encounter for screening for malignant neoplasm of colon: Secondary | ICD-10-CM | POA: Diagnosis present

## 2021-07-01 DIAGNOSIS — D124 Benign neoplasm of descending colon: Secondary | ICD-10-CM | POA: Diagnosis not present

## 2021-07-01 DIAGNOSIS — D122 Benign neoplasm of ascending colon: Secondary | ICD-10-CM | POA: Insufficient documentation

## 2021-07-01 HISTORY — PX: COLONOSCOPY WITH PROPOFOL: SHX5780

## 2021-07-01 SURGERY — COLONOSCOPY WITH PROPOFOL
Anesthesia: General

## 2021-07-01 MED ORDER — PROPOFOL 10 MG/ML IV BOLUS
INTRAVENOUS | Status: AC
  Filled 2021-07-01: qty 60

## 2021-07-01 MED ORDER — PROPOFOL 500 MG/50ML IV EMUL
INTRAVENOUS | Status: DC | PRN
  Administered 2021-07-01: 165 ug/kg/min via INTRAVENOUS

## 2021-07-01 MED ORDER — SODIUM CHLORIDE 0.9 % IV SOLN: INTRAVENOUS | Status: DC

## 2021-07-01 MED ORDER — PROPOFOL 500 MG/50ML IV EMUL
INTRAVENOUS | Status: AC
  Filled 2021-07-01: qty 50

## 2021-07-01 MED ORDER — PROPOFOL 10 MG/ML IV BOLUS
INTRAVENOUS | Status: DC | PRN
Start: 2021-07-01 — End: 2021-07-01
  Administered 2021-07-01: 20 mg via INTRAVENOUS
  Administered 2021-07-01: 100 mg via INTRAVENOUS

## 2021-07-01 MED ORDER — PROPOFOL 10 MG/ML IV BOLUS
INTRAVENOUS | Status: AC
Start: 2021-07-01 — End: ?
  Filled 2021-07-01: qty 20

## 2021-07-01 NOTE — Transfer of Care (Signed)
Immediate Anesthesia Transfer of Care Note ? ?Patient: Alexandra Good ? ?Procedure(s) Performed: COLONOSCOPY WITH PROPOFOL ? ?Patient Location: Endoscopy Unit ? ?Anesthesia Type:General ? ?Level of Consciousness: awake ? ?Airway & Oxygen Therapy: Patient Spontanous Breathing ? ?Post-op Assessment: Report given to RN and Post -op Vital signs reviewed and stable ? ?Post vital signs: Reviewed and stable ? ?Last Vitals:  ?Vitals Value Taken Time  ?BP    ?Temp    ?Pulse    ?Resp    ?SpO2    ? ? ?Last Pain:  ?Vitals:  ? 07/01/21 0907  ?TempSrc: Tympanic  ?PainSc: 0-No pain  ?   ? ?  ? ?Complications: No notable events documented. ?

## 2021-07-01 NOTE — Anesthesia Preprocedure Evaluation (Signed)
Anesthesia Evaluation  ?Patient identified by MRN, date of birth, ID band ?Patient awake ? ? ? ?Reviewed: ?Allergy & Precautions, NPO status , Patient's Chart, lab work & pertinent test results ? ?History of Anesthesia Complications ?Negative for: history of anesthetic complications ? ?Airway ?Mallampati: III ? ?TM Distance: >3 FB ?Neck ROM: full ? ? ? Dental ? ?(+) Chipped ?  ?Pulmonary ?neg shortness of breath, sleep apnea ,  ?  ?Pulmonary exam normal ? ? ? ? ? ? ? Cardiovascular ?Exercise Tolerance: Good ?hypertension, (-) anginaNormal cardiovascular exam ? ? ?  ?Neuro/Psych ?PSYCHIATRIC DISORDERS  Neuromuscular disease   ? GI/Hepatic ?negative GI ROS, Neg liver ROS, neg GERD  ,  ?Endo/Other  ?diabetes, Type 2Hyperthyroidism  ? Renal/GU ?negative Renal ROS  ?negative genitourinary ?  ?Musculoskeletal ? ?(+) Arthritis , Fibromyalgia - ? Abdominal ?  ?Peds ? Hematology ?negative hematology ROS ?(+)   ?Anesthesia Other Findings ?Past Medical History: ?No date: Anxiety ?No date: Chest pain ?No date: Chronic pain disorder ?No date: DDD (degenerative disc disease), lumbar ?No date: Depression ?No date: Essential (primary) hypertension ?No date: Fibromyalgia ?No date: HTN (hypertension) ?No date: Hyperlipidemia ?No date: Hyperthyroidism ?No date: Insomnia ?No date: Mixed connective tissue disease ([place]) ?No date: Mixed hyperlipidemia ?No date: Palpitations ?No date: Palpitations ?05/25/2021: PVC (premature ventricular contraction) ?No date: Raynaud's syndrome ?No date: 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 ?Past Surgical History: ?2004: ANTERIOR CRUCIATE LIGAMENT REPAIR; Right ?11/18/2013: right hip replacement; Right ?1991: TUBAL LIGATION ? ?BMI   ? Body Mass Index: 39.87 kg/m?  ?  ? ? Reproductive/Obstetrics ?negative OB ROS ? ?   ? ? ? ? ? ? ? ? ? ? ? ? ? ?  ?  ? ? ? ? ? ? ? ? ?Anesthesia Physical ? ?Anesthesia Plan ? ?ASA: 3 ? ?Anesthesia Plan: General  ? ?Post-op Pain Management:   ? ?Induction: Intravenous ? ?PONV Risk Score and Plan: Propofol infusion and TIVA ? ?Airway Management Planned: Natural Airway and Nasal Cannula ? ?Additional Equipment:  ? ?Intra-op Plan:  ? ?Post-operative Plan:  ? ?Informed Consent: I have reviewed the patients History and Physical, chart, labs and discussed the procedure including the risks, benefits and alternatives for the proposed anesthesia with the patient or authorized representative who has indicated his/her understanding and acceptance.  ? ? ? ?Dental Advisory Given ? ?Plan Discussed with: Anesthesiologist, CRNA and Surgeon ? ?Anesthesia Plan Comments: ( ?Patient consented for risks of anesthesia including but not limited to:  ?- adverse reactions to medications ?- risk of airway placement if required ?- damage to eyes, teeth, lips or other oral mucosa ?- nerve damage due to positioning  ?- sore throat or hoarseness ?- Damage to heart, brain, nerves, lungs, other parts of body or loss of life ? ?Patient voiced understanding.)  ? ? ? ? ? ? ?Anesthesia Quick Evaluation ? ?

## 2021-07-01 NOTE — Anesthesia Postprocedure Evaluation (Signed)
Anesthesia Post Note ? ?Patient: Alexandra Good ? ?Procedure(s) Performed: COLONOSCOPY WITH PROPOFOL ? ?Patient location during evaluation: Endoscopy ?Anesthesia Type: General ?Level of consciousness: awake and alert ?Pain management: pain level controlled ?Vital Signs Assessment: post-procedure vital signs reviewed and stable ?Respiratory status: spontaneous breathing, nonlabored ventilation, respiratory function stable and patient connected to nasal cannula oxygen ?Cardiovascular status: blood pressure returned to baseline and stable ?Postop Assessment: no apparent nausea or vomiting ?Anesthetic complications: no ? ? ?No notable events documented. ? ? ?Last Vitals:  ?Vitals:  ? 07/01/21 1115 07/01/21 1123  ?BP: 120/73 125/78  ?Pulse: 78 78  ?Resp: 14 16  ?Temp:    ?SpO2: 100%   ?  ?Last Pain:  ?Vitals:  ? 07/01/21 1115  ?TempSrc:   ?PainSc: 0-No pain  ? ? ?  ?  ?  ?  ?  ?  ? ?[name] ? ? ? ? ?

## 2021-07-01 NOTE — Op Note (Signed)
[facility] ?Gastroenterology ?Patient Name: Alexandra Good ?Procedure Date: 07/01/2021 10:30 AM ?MRN: [id number] ?Account #: [id number] ?Date of Birth: October 01, 1965 ?Admit Type: Outpatient ?Age: 56 ?Room: [facility] ENDO ROOM 4 ?Gender: Female ?Note Status: Finalized ?Instrument Name: Colonoscope [id number] ?Procedure:             Colonoscopy ?Indications:           Screening for colorectal malignant neoplasm ?Providers:             Jonathon Bellows MD, MD ?Referring MD:          Orlando Penner. Iona Beard (Referring MD) ?Medicines:             Monitored Anesthesia Care ?Complications:         No immediate complications. ?Procedure:             Pre-Anesthesia Assessment: ?                       - Prior to the procedure, a History and Physical was  ?                       performed, and patient medications, allergies and  ?                       sensitivities were reviewed. The patient's tolerance  ?                       of previous anesthesia was reviewed. ?                       - The risks and benefits of the procedure and the  ?                       sedation options and risks were discussed with the  ?                       patient. All questions were answered and informed  ?                       consent was obtained. ?                       - ASA Grade Assessment: II - A patient with mild  ?                       systemic disease. ?                       After obtaining informed consent, the colonoscope was  ?                       passed under direct vision. Throughout the procedure,  ?                       the patient's blood pressure, pulse, and oxygen  ?                       saturations were monitored continuously. The  ?                       Colonoscope was introduced  through the anus and  ?                       advanced to the the cecum, identified by the  ?                       appendiceal orifice. The colonoscopy was performed  ?                       with ease. The patient tolerated the procedure well.  ?                        The quality of the bowel preparation was excellent. ?Findings: ?     The perianal and digital rectal examinations were normal. ?     Three sessile polyps were found in the sigmoid colon, descending colon  ?     and ascending colon. The polyps were 5 to 7 mm in size. These polyps  ?     were removed with a cold snare. Resection and retrieval were complete. ?     Multiple small and large-mouthed diverticula were found in the entire  ?     colon. ?     The exam was otherwise without abnormality on direct and retroflexion  ?     views. ?Impression:            - Three 5 to 7 mm polyps in the sigmoid colon, in the  ?                       descending colon and in the ascending colon, removed  ?                       with a cold snare. Resected and retrieved. ?                       - Diverticulosis in the entire examined colon. ?                       - The examination was otherwise normal on direct and  ?                       retroflexion views. ?Recommendation:        - Discharge patient to home (with escort). ?                       - Resume previous diet. ?                       - Continue present medications. ?                       - Await pathology results. ?                       - Repeat colonoscopy for surveillance based on  ?                       pathology results. ?Procedure Code(s):     --- Professional --- ?  45385, Colonoscopy, flexible; with removal of  ?                       tumor(s), polyp(s), or other lesion(s) by snare  ?                       technique ?Diagnosis Code(s):     --- Professional --- ?                       Z12.11, Encounter for screening for malignant neoplasm  ?                       of colon ?                       K63.5, Polyp of colon ?                       K57.30, Diverticulosis of large intestine without  ?                       perforation or abscess without bleeding ?CPT copyright 2019 American Medical Association. All rights  reserved. ?The codes documented in this report are preliminary and upon coder review may  ?be revised to meet current compliance requirements. ?Jonathon Bellows, MD ?Jonathon Bellows MD, MD ?07/01/2021 11:03:14 AM ?This report has been signed electronically. ?Number of Addenda: 0 ?Note Initiated On: 07/01/2021 10:30 AM ?Scope Withdrawal Time: 0 hours 20 minutes 23 seconds  ?Total Procedure Duration: 0 hours 23 minutes 50 seconds  ?Estimated Blood Loss:  Estimated blood loss: none. ?     [facility] ?

## 2021-07-01 NOTE — H&P (Signed)
? ? ? ?Jonathon Bellows, MD ?[address], [place], [place], [place], [address] ?[address] [place], [place], [place], [address] ?Phone: [phone]  ?Fax: [phone] ? ?Primary Care Physician:  Zoila Shutter, NP ? ? ?Pre-Procedure History & Physical: ?HPI:  Alexandra Good is a 56 y.o. female is here for an colonoscopy. ?  ?Past Medical History:  ?Diagnosis Date  ? Anxiety   ? Chest pain   ? Chronic pain disorder   ? DDD (degenerative disc disease), lumbar   ? Depression   ? Essential (primary) hypertension   ? Fibromyalgia   ? HTN (hypertension)   ? Hyperlipidemia   ? Hyperthyroidism   ? Insomnia   ? Mixed connective tissue disease ([place])   ? Mixed hyperlipidemia   ? Palpitations   ? Palpitations   ? PVC (premature ventricular contraction) 05/25/2021  ? Raynaud's syndrome   ? 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 Type 2 diabetes mellitus without complications ([place])   ? Vitamin B12 deficiency   ? Vitamin D deficiency   ? ? ?Past Surgical History:  ?Procedure Laterality Date  ? ANTERIOR CRUCIATE LIGAMENT REPAIR Right 2004  ? ESOPHAGOGASTRODUODENOSCOPY (EGD) WITH PROPOFOL N/A 06/25/2021  ? Procedure: ESOPHAGOGASTRODUODENOSCOPY (EGD) WITH PROPOFOL;  Surgeon: Jonathon Bellows, MD;  Location[facility] ENDOSCOPY;  Service: Gastroenterology;  Laterality: N/A;  ? right hip replacement Right 11/18/2013  ? TUBAL LIGATION  1991  ? ? ?Prior to Admission medications   ?Medication Sig Start Date End Date Taking? Authorizing Provider  ?cloNIDine (CATAPRES) 0.1 MG tablet Take 1 tablet (0.1 mg total) by mouth 2 (two) times daily. 06/30/21  Yes Vickie Epley, MD  ?hydrochlorothiazide (HYDRODIURIL) 25 MG tablet Take 50 mg by mouth daily.   Yes [provider]  ?levothyroxine (SYNTHROID) 75 MCG tablet Take 75 mcg by mouth daily before breakfast.   Yes [provider]  ?ALPRAZolam (XANAX) 0.5 MG tablet Take 0.5 mg by mouth 2 (two) times daily.    [provider]  ?diclofenac Sodium (VOLTAREN) 1 % GEL  Apply 1 application. topically 2 (two) times daily as needed for pain.    [provider]  ?gabapentin (NEURONTIN) 300 MG capsule Take 300 mg by mouth 2 (two) times daily.    [provider]  ?hydroxychloroquine (PLAQUENIL) 200 MG tablet Take 200 mg by mouth 2 (two) times daily.    [provider]  ?ibuprofen (ADVIL) 800 MG tablet Take 800 mg by mouth 3 (three) times daily as needed for pain.    [provider]  ?lisinopril (ZESTRIL) 40 MG tablet Take 1 tablet (40 mg total) by mouth daily. 06/30/21   Vickie Epley, MD  ?omeprazole (PRILOSEC) 40 MG capsule Take 1 capsule (40 mg total) by mouth daily. 06/24/21   Jonathon Bellows, MD  ?propafenone (RYTHMOL SR) 225 MG 12 hr capsule Take 1 capsule (225 mg total) by mouth 2 (two) times daily. 06/30/21   Vickie Epley, MD  ?tiZANidine (ZANAFLEX) 4 MG tablet Take 4 mg by mouth 3 (three) times daily as needed for muscle spasms.    [provider]  ?topiramate (TOPAMAX) 100 MG tablet Take 100 mg by mouth daily.    [provider]  ?valACYclovir (VALTREX) 1000 MG tablet Take 1,000 mg by mouth daily.    [provider]  ? ? ?Allergies as of 06/24/2021 - Review Complete 06/24/2021  ?Allergen Reaction Noted  ? Amlodipine besylate  05/10/2021  ? Crestor [rosuvastatin]  04/22/2021  ? ? ?  Family History  ?Problem Relation Age of Onset  ? Coronary artery disease Mother   ? Atrial fibrillation Mother   ? Heart attack Father 41  ? Stroke Maternal Aunt   ? Heart attack Maternal Uncle   ? ? ?Social History  ? ?Socioeconomic History  ? Marital status: Married  ?  Spouse name: Not on file  ? Number of children: Not on file  ? Years of education: Not on file  ? Highest education Good: Not on file  ?Occupational History  ? Not on file  ?Tobacco Use  ? Smoking status: Never  ? Smokeless tobacco: Never  ?Vaping Use  ? Vaping Use: Never used  ?Substance and Sexual Activity  ? Alcohol use: Never  ? Drug use: Never  ? Sexual  activity: Not on file  ?Other Topics Concern  ? Not on file  ?Social History Narrative  ? ** Merged History Encounter **  ?    ? ?Social Determinants of Health  ? ?Financial Resource Strain: Low Risk   ? Difficulty of Paying Living Expenses: Not hard at all  ?Food Insecurity: No Food Insecurity  ? Worried About Charity fundraiser in the Last Year: Never true  ? Ran Out of Food in the Last Year: Never true  ?Transportation Needs: No Transportation Needs  ? Lack of Transportation (Medical): No  ? Lack of Transportation (Non-Medical): No  ?Physical Activity: Inactive  ? Days of Exercise per Week: 0 days  ? Minutes of Exercise per Session: 0 min  ?Stress: Not on file  ?Social Connections: Not on file  ?Intimate Partner Violence: Not on file  ? ? ?Review of Systems: ?See HPI, otherwise negative ROS ? ?Physical Exam: ?BP (!) 152/107   Pulse (!) 59   Temp (!) 97.5 ?F (36.4 ?C) (Tympanic)   Resp 20   Ht '5\' 2"'$  (1.575 m)   Wt 96.2 kg   SpO2 100%   BMI 38.78 kg/m?  ?General:   Alert,  pleasant and cooperative in NAD ?Head:  Normocephalic and atraumatic. ?Neck:  Supple; no masses or thyromegaly. ?Lungs:  Clear throughout to auscultation, normal respiratory effort.    ?Heart:  +S1, +S2, Regular rate and rhythm, No edema. ?Abdomen:  Soft, nontender and nondistended. Normal bowel sounds, without guarding, and without rebound.   ?Neurologic:  Alert and  oriented x4;  grossly normal neurologically. ? ?Impression/Plan: ?Alexandra Good is here for an colonoscopy to be performed for Screening colonoscopy average risk   ?Risks, benefits, limitations, and alternatives regarding  colonoscopy have been reviewed with the patient.  Questions have been answered.  All parties agreeable. ? ? ?Jonathon Bellows, MD  07/01/2021, 10:28 AM ? ?

## 2021-07-02 ENCOUNTER — Encounter: Payer: Self-pay | Admitting: Gastroenterology

## 2021-07-02 LAB — SURGICAL PATHOLOGY

## 2021-07-05 ENCOUNTER — Encounter: Payer: Self-pay | Admitting: Gastroenterology

## 2021-07-07 ENCOUNTER — Encounter: Payer: Self-pay | Admitting: Cardiology

## 2021-07-09 ENCOUNTER — Ambulatory Visit (INDEPENDENT_AMBULATORY_CARE_PROVIDER_SITE_OTHER): Payer: BC Managed Care – PPO

## 2021-07-09 ENCOUNTER — Telehealth: Payer: Self-pay | Admitting: *Deleted

## 2021-07-09 VITALS — BP 184/90 | HR 51

## 2021-07-09 DIAGNOSIS — I493 Ventricular premature depolarization: Secondary | ICD-10-CM

## 2021-07-09 NOTE — Telephone Encounter (Signed)
Erasmo Score already scheduled patient for HST.

## 2021-07-09 NOTE — Progress Notes (Signed)
   Nurse Visit   Date of Encounter: 07/09/2021 ID: Alexandra Good, DOB 10-16-1965, MRN [id number]  PCP:  Zoila Shutter, NP   [facility] Providers Cardiologist:  Skeet Latch, MD Electrophysiologist:  Vickie Epley, MD      Visit Details   VS:  BP (!) 184/90 (BP Location: Left Arm, Patient Position: Sitting, Cuff Size: Large)   Pulse (!) 51  , BMI There is no height or weight on file to calculate BMI.  Wt Readings from Last 3 Encounters:  07/01/21 212 lb (96.2 kg)  06/30/21 212 lb (96.2 kg)  06/25/21 218 lb (98.9 kg)     Reason for visit: Nurse Visit EKG patients started on Rythmol for symptomatic PVCs  Performed today: Vitals, EKG, Provider consulted:Dr. Garen Lah, and Education Changes (medications, testing, etc.) : Resume Losartan 50 mg daily Length of Visit: 25 minutes   Patient states that she is tolerating propafenone 225 mg bid well. She reports decreased palpitations. Patient does monitor her blood pressure and heartrate daily. She is keeping a BP log. She reports consistently elevated daily blood pressures. Her HRs are usually in the 50's bpm. She recently reported a cough with lisinopril and has been instructed to stop. Consulted with the office DOD Dr. Garen Lah who reviewed the EKG whisch demonstrates sinus bradycardia.Dr. Garen Lah recommends that she resume losartan 50 mg daily. Patient states that she has a supply at home. She is to follow up soon with Dr. Oval Linsey for further management of her BP. Appt scheduled with Dr. Oval Linsey on 08/06/21.  Patient also returned her unopen WatchPat box. Patient states that no one contacted her with the insurance approval and she would rather not proceed with the test. Advised the patient to continue to monitor her BP and HR daily and keep a log. She is to bring them with her to her next appt. Advised the patient to contact the office in the interim if symptoms develop or if BP remains consistently  elevated.   Medications Adjustments/Labs and Tests Ordered: Orders Placed This Encounter  Procedures   EKG 12-Lead   No orders of the defined types were placed in this encounter.    Signed, Lamar Laundry, RN  07/09/2021 10:32 AM

## 2021-07-09 NOTE — Telephone Encounter (Signed)
Patient already scheduled for HST.

## 2021-07-21 ENCOUNTER — Encounter (HOSPITAL_BASED_OUTPATIENT_CLINIC_OR_DEPARTMENT_OTHER): Payer: BC Managed Care – PPO | Admitting: Cardiovascular Disease

## 2021-07-29 ENCOUNTER — Ambulatory Visit (HOSPITAL_BASED_OUTPATIENT_CLINIC_OR_DEPARTMENT_OTHER): Payer: Self-pay | Admitting: Cardiovascular Disease

## 2021-07-30 ENCOUNTER — Telehealth: Payer: Self-pay | Admitting: Cardiovascular Disease

## 2021-07-30 NOTE — Telephone Encounter (Signed)
*  STAT* If patient is at the pharmacy, call can be transferred to refill team.   1. Which medications need to be refilled? (please list name of each medication and dose if known)  cloNIDine (CATAPRES) 0.1 MG tablet  2. Which pharmacy/location (including street and city if local pharmacy) is medication to be sent to? [place], [place]  3. Do they need a 30 day or 90 day supply? 90 day

## 2021-08-02 NOTE — Telephone Encounter (Signed)
Rx(s) sent to pharmacy electronically.  

## 2021-08-06 ENCOUNTER — Ambulatory Visit (HOSPITAL_BASED_OUTPATIENT_CLINIC_OR_DEPARTMENT_OTHER): Payer: BC Managed Care – PPO | Admitting: Cardiovascular Disease

## 2021-08-18 ENCOUNTER — Encounter: Payer: Self-pay | Admitting: *Deleted

## 2021-08-30 ENCOUNTER — Ambulatory Visit: Payer: BC Managed Care – PPO | Admitting: Gastroenterology

## 2021-08-30 ENCOUNTER — Other Ambulatory Visit: Payer: Self-pay

## 2021-08-30 DIAGNOSIS — I482 Chronic atrial fibrillation, unspecified: Secondary | ICD-10-CM | POA: Insufficient documentation

## 2021-09-30 ENCOUNTER — Ambulatory Visit (HOSPITAL_BASED_OUTPATIENT_CLINIC_OR_DEPARTMENT_OTHER): Payer: BC Managed Care – PPO | Admitting: Cardiovascular Disease

## 2021-09-30 NOTE — Progress Notes (Deleted)
Cardiology Office Note:    Date:  09/30/2021   ID:  Alexandra Good, DOB [birth year], MRN [id number]  PCP:  Zoila Shutter, NP  Cardiologist:  None    Referring MD: Zoila Shutter, NP   No chief complaint on file.   History of Present Illness:    Alexandra Good is a 56 y.o. female with a hx of hypertension, hyperlipidemia, PVCs, and hyperthyroidism here for follow up.  She was first seen 05/2021 for the evaluation of chest pain, palpitations, and hypertension. She was seen in the ED 05/20/21 for chest pain and palpitations and her blood pressure was elevated at 148/84. She was treated with morphine, fentanyl, and a GI cocktail. EKG showed frequent PVCs. Aortic CT was negative for dissection.   She previously saw cardiology in [facility] in 2021. She wore a monitor that showed some bradycardia to the 40s and 50s at night, 20000 PVCs, and ventricular bigeminy. She reported home blood pressures of 347Q to 259D systolic though her in-office pressure was 140/90. Her symptoms were thought to be due to anxiety and was was started on metoprolol. However, she was hesitant to start due to low heart rates on her pulse oximeter. The cardiologist felt that these were inaccurate due to ventricular bigeminy. Echo at this time was LVEF 50-55%.  She previously had a negative sleep study.  At her initial visit her blood pressures were very labile.  She was taking both lisinopril and losartan.  She was switched to valsartan/HCTZ.  Carvedilol was also added.  She had bradycardia on higher doses of metoprolol.  She had an echo 05/2021 that revealed LVEF 60 to 65% with moderate LVH and grade 2 diastolic dysfunction.  She was referred for home sleep study but has not had it.  She saw Dr. Quentin Ore and had stopped her carvedilol and Diovan due to concern of swelling.  She resumed her prior lisinopril and HCTZ.  He increased her lisinopril and scheduled her on clonidine twice daily.  He felt that her PVCs were coming from the  outflow tract and started her on propafenone.  Today, she is accompanied by her husband and not doing the best. Occasionally, she reports pressure and tightness deep under her L breast with associated palpitations. Her heart rate could range from 30s to 70s. These episodes occur randomly regardless of exertion and leave her feeling fatigued. She has been told she has left ventricle hardening. Her husband endorses she snores. She does not feel rested in the morning. However, she has taken a sleep study in the past and was told she did not have sleep apnea. Her husband reports her feet and ankles will swell but she associates this to being on her feet all day at work. She also reports occasional shortness of breath. Her blood pressure at home can increase up to 180/100. Her blood pressure in the ER was 638V systolic. Metoprolol caused her heart rate to stay in the 50s and caused her to feel constantly fatigued. She drinks 1 cup of coffee in the morning and 1 more after work and water throughout the day. 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 She does not watch her salt intake. Currently, she takes both losartan and lisinopril. She can take clonidine 3 to 4 times per week. She endorses swelling on amlodipine. Her father died of MI at 10 years old. Her mother has Afib and has stents placed. Her  maternal aunt had strokes. Her maternal uncle also died of MI. She denies any lightheadedness, headaches, syncope, orthopnea, or PND.  Past Medical History:  Diagnosis Date   Anxiety    Chest pain    Chronic pain disorder    DDD (degenerative disc disease), lumbar    Depression    Essential (primary) hypertension    Fibromyalgia    HTN (hypertension)    Hyperlipidemia    Hyperthyroidism    Insomnia    Mixed connective tissue disease ([place])    Mixed hyperlipidemia    Palpitations    Palpitations    PVC (premature ventricular contraction)  05/25/2021   Raynaud's syndrome    Systemic involvement of connective tissue ([place])    Type 2 diabetes mellitus without complications ([place])    Vitamin B12 deficiency    Vitamin D deficiency     Past Surgical History:  Procedure Laterality Date   ANTERIOR CRUCIATE LIGAMENT REPAIR Right 2004   COLONOSCOPY WITH PROPOFOL N/A 07/01/2021   Procedure: COLONOSCOPY WITH PROPOFOL;  Surgeon: Jonathon Bellows, MD;  Location: [facility] ENDOSCOPY;  Service: Gastroenterology;  Laterality: N/A;   ESOPHAGOGASTRODUODENOSCOPY (EGD) WITH PROPOFOL N/A 06/25/2021   Procedure: ESOPHAGOGASTRODUODENOSCOPY (EGD) WITH PROPOFOL;  Surgeon: Jonathon Bellows, MD;  Location: [facility] ENDOSCOPY;  Service: Gastroenterology;  Laterality: N/A;   right hip replacement Right 11/18/2013   TUBAL LIGATION  1991    Current Medications: No outpatient medications have been marked as taking for the 09/30/21 encounter (Appointment) with Skeet Latch, MD.     Allergies:   Amlodipine besylate, Carvedilol, Crestor [rosuvastatin], Lisinopril, and Valsartan-hydrochlorothiazide   Social History   Socioeconomic History   Marital status: Married    Spouse name: Not on file   Number of children: Not on file   Years of education: Not on file   Highest education level: Not on file  Occupational History   Not on file  Tobacco Use   Smoking status: Never   Smokeless tobacco: Never  Vaping Use   Vaping Use: Never used  Substance and Sexual Activity   Alcohol use: Never   Drug use: Never   Sexual activity: Not on file  Other Topics Concern   Not on file  Social History Narrative   ** Merged History Encounter **       Social Determinants of Health   Financial Resource Strain: Low Risk  (05/25/2021)   Overall Financial Resource Strain (CARDIA)    Difficulty of Paying Living Expenses: Not hard at all  Food Insecurity: No Food Insecurity (05/25/2021)   Hunger Vital Sign    Worried About Running Out of Food in the Last Year: Never true    Ran Out  of Food in the Last Year: Never true  Transportation Needs: No Transportation Needs (05/25/2021)   PRAPARE - Hydrologist (Medical): No    Lack of Transportation (Non-Medical): No  Physical Activity: Inactive (05/25/2021)   Exercise Vital Sign    Days of Exercise per Week: 0 days    Minutes of Exercise per Session: 0 min  Stress: Not on file  Social Connections: Not on file     Family History: The patient's family history includes Atrial fibrillation in her mother; Coronary artery disease in her mother; Heart attack in her maternal uncle; Heart attack (age of onset: 51) in her father; Stroke in her maternal aunt.  ROS:   Please see the history of present illness.    (+) Chest pain (+) Palpitations (+) Fatigue (+)  Snoring (+) Daytime somnolence (+) LE edema (Bilateral) (+) Shortness of breath All other systems reviewed and negative.   EKGs/Labs/Other Studies Reviewed:    The following studies were reviewed today: CTA Chest 05/13/21 ([place]) No evidence of acute aortic dissection   AORTA: Normal caliber aorta. No thoracic aortic intramural hematoma.  No aortic dissection.   CHEST: Normal heart size.  No pericardial effusion. No mediastinal lymphadenopathy.  Clear central airways. No consolidation.  No pleural effusion.   ABDOMEN and PELVIS:  HEPATOBILIARY: No focal hepatic lesions. The gallbladder is surgically absent. No biliary dilatation.    SPLEEN: Unremarkable.  PANCREAS: Focal fatty atrophy of the pancreatic head. Otherwise, unremarkable.   ADRENALS: Unremarkable.  KIDNEYS/URETERS: No hydronephrosis or suspicious mass.   BLADDER: Unremarkable.  PELVIC/REPRODUCTIVE ORGANS: The uterus is present.   GI TRACT: No dilated or thick walled loops of bowel. Appendix is unremarkable (6:155). Colonic diverticulosis without evidence of diverticulitis.   PERITONEUM/RETROPERITONEUM AND MESENTERY: No free air or fluid.  LYMPH  NODES: No enlarged lymph nodes. Prominent right external iliac lymph node (6:148).  BONES: Right total hip arthroplasty with associated metallic streak artifact.   Mild lower thoracic and lumbar spine degenerative disease.   SOFT TISSUES: Small fat-containing periumbilical hernia. Fatty atrophy of the right iliac a and psoas musculature. Bilateral fat-containing inguinal hernias.  Monitor 11/27/19 ([facility] Every[facility]) Baseline Rhythm: sinus rhythm   Rhythm Findings:   1.  Ventricular: frequent single PVCs and couplets, there were total of 20,635 PVCs all of which were single PVCs with exception of 3 couplets.  There were long episodes of ventricular bigeminy throughout the tracing.   2.  Supraventricular: rare single PACs and 1 run of 4 beats at 119 bpm, total of 18 PACs with 14 single PACs   3.  Bradyarrhythmias and Pauses: no, longest R-R interval is 1.3 seconds   4.  LOWEST  HR = 52 BPM,  AVERAGE  HR = 80 BPM,  HIGHEST  HR = 126 BPM     Symptoms reported:  None reported   CONCLUSIONS:  1.  abnormal Holter monitor.  2.  Arrhythmia present as described  3.  Symptoms were not reported  4.  Symptoms were not correlated with arrhythmias   Echo 11/27/19 ([facility]) SUMMARY  NPS.  The left ventricle is borderline dilated.  Mild left ventricular hypertrophy  Left ventricular systolic function is borderline reduced.  LV ejection fraction = 50-55%.  There is borderline global hypokinesis of the left ventricle.  The left atrium is mildly dilated.  There is mild tricuspid regurgitation.  Estimated right ventricular systolic pressure is 31 mmHg.   Echo 05/2021: IMPRESSIONS     1. GLS -8.2. Left ventricular ejection fraction, by estimation, is 60 to  65%. The left ventricle has normal function. The left ventricle has no  regional wall motion abnormalities. There is moderate left ventricular  hypertrophy. Left ventricular  diastolic parameters are consistent with  Grade II diastolic dysfunction  (pseudonormalization).   2. Right ventricular systolic function is normal. The right ventricular  size is normal. There is normal pulmonary artery systolic pressure.   3. The mitral valve is normal in structure. Trivial mitral valve  regurgitation. No evidence of mitral stenosis.   4. The aortic valve is normal in structure. Aortic valve regurgitation is  not visualized. No aortic stenosis is present.   5. The inferior vena cava is normal in size with greater than 50%  respiratory  variability, suggesting right atrial pressure of 3 mmHg.   EKG:   05/25/21: Ventricular trigeminy, rate 71 bpm  Recent Labs: 06/01/2021: BUN 10; Creatinine, Ser 0.74; Potassium 4.3; Sodium 145   Recent Lipid Panel No results found for: "CHOL", "TRIG", "HDL", "CHOLHDL", "VLDL", "LDLCALC", "LDLDIRECT"   Physical Exam:    VS:  There were no vitals taken for this visit. , BMI There is no height or weight on file to calculate BMI. GENERAL:  Well appearing HEENT: Pupils equal round and reactive, fundi not visualized, oral mucosa unremarkable NECK:  No jugular venous distention, waveform within normal limits, carotid upstroke brisk and symmetric, no bruits, no thyromegaly LUNGS:  Clear to auscultation bilaterally HEART:  RRR.  PMI not displaced or sustained,S1 and S2 within normal limits, no S3, no S4, no clicks, no rubs, no murmurs ABD:  Flat, positive bowel sounds normal in frequency in pitch, no bruits, no rebound, no guarding, no midline pulsatile mass, no hepatomegaly, no splenomegaly EXT:  2 plus pulses throughout, no edema, no cyanosis no clubbing SKIN:  No rashes no nodules NEURO:  Cranial nerves II through XII grossly intact, motor grossly intact throughout PSYCH:  Cognitively intact, oriented to person place and time  ASSESSMENT:    No diagnosis found.  PLAN:    No problem-specific Assessment & Plan notes found for this encounter.      Disposition: FU with  Whitnee Orzel C. Oval Linsey, MD, [facility] in 2 months  Medication Adjustments/Labs and Tests Ordered: Current medicines are reviewed at length with the patient today.  Concerns regarding medicines are outlined above.  No orders of the defined types were placed in this encounter.  No orders of the defined types were placed in this encounter.    Signed, Skeet Latch, MD  09/30/2021 1:06 PM    [place] she is

## 2021-10-06 ENCOUNTER — Other Ambulatory Visit: Payer: Self-pay | Admitting: Gastroenterology

## 2021-10-27 ENCOUNTER — Ambulatory Visit: Payer: BC Managed Care – PPO | Admitting: Cardiology
